# Patient Record
Sex: Female | Born: 1940 | Race: White | Hispanic: No | Smoking: Never smoker
Health system: Southern US, Community
[De-identification: ages and names within clinical notes are randomized; demographics above are authoritative.]

## PROBLEM LIST (undated history)

## (undated) DIAGNOSIS — T7840XA Allergy, unspecified, initial encounter: Secondary | ICD-10-CM

## (undated) DIAGNOSIS — M7120 Synovial cyst of popliteal space [Baker], unspecified knee: Secondary | ICD-10-CM

## (undated) DIAGNOSIS — M1990 Unspecified osteoarthritis, unspecified site: Secondary | ICD-10-CM

## (undated) DIAGNOSIS — K219 Gastro-esophageal reflux disease without esophagitis: Secondary | ICD-10-CM

## (undated) HISTORY — DX: Gastro-esophageal reflux disease without esophagitis: K21.9

## (undated) HISTORY — DX: Synovial cyst of popliteal space (Baker), unspecified knee: M71.20

## (undated) HISTORY — DX: Allergy, unspecified, initial encounter: T78.40XA

## (undated) HISTORY — PX: APPENDECTOMY: SHX54

## (undated) HISTORY — PX: TUBAL LIGATION: SHX77

## (undated) HISTORY — DX: Unspecified osteoarthritis, unspecified site: M19.90

## (undated) HISTORY — PX: COLONOSCOPY: SHX174

---

## 1997-09-29 ENCOUNTER — Ambulatory Visit (HOSPITAL_COMMUNITY): Admission: RE | Admit: 1997-09-29 | Discharge: 1997-09-29 | Payer: Self-pay | Admitting: Family Medicine

## 1997-09-29 ENCOUNTER — Encounter: Payer: Self-pay | Admitting: Family Medicine

## 1999-02-16 ENCOUNTER — Other Ambulatory Visit: Admission: RE | Admit: 1999-02-16 | Discharge: 1999-02-16 | Payer: Self-pay | Admitting: Family Medicine

## 1999-07-18 ENCOUNTER — Encounter: Payer: Self-pay | Admitting: Family Medicine

## 1999-07-18 ENCOUNTER — Ambulatory Visit (HOSPITAL_COMMUNITY): Admission: RE | Admit: 1999-07-18 | Discharge: 1999-07-18 | Payer: Self-pay | Admitting: Family Medicine

## 2001-02-13 ENCOUNTER — Encounter: Payer: Self-pay | Admitting: Family Medicine

## 2001-02-13 ENCOUNTER — Ambulatory Visit (HOSPITAL_COMMUNITY): Admission: RE | Admit: 2001-02-13 | Discharge: 2001-02-13 | Payer: Self-pay | Admitting: Family Medicine

## 2001-02-24 ENCOUNTER — Other Ambulatory Visit: Admission: RE | Admit: 2001-02-24 | Discharge: 2001-02-24 | Payer: Self-pay | Admitting: Family Medicine

## 2002-12-16 ENCOUNTER — Ambulatory Visit (HOSPITAL_COMMUNITY): Admission: RE | Admit: 2002-12-16 | Discharge: 2002-12-16 | Payer: Self-pay | Admitting: Family Medicine

## 2003-06-25 ENCOUNTER — Other Ambulatory Visit: Admission: RE | Admit: 2003-06-25 | Discharge: 2003-06-25 | Payer: Self-pay | Admitting: Family Medicine

## 2004-03-28 ENCOUNTER — Other Ambulatory Visit: Admission: RE | Admit: 2004-03-28 | Discharge: 2004-03-28 | Payer: Self-pay | Admitting: Obstetrics and Gynecology

## 2004-06-27 ENCOUNTER — Encounter (INDEPENDENT_AMBULATORY_CARE_PROVIDER_SITE_OTHER): Payer: Self-pay | Admitting: Specialist

## 2004-06-27 ENCOUNTER — Inpatient Hospital Stay (HOSPITAL_COMMUNITY): Admission: RE | Admit: 2004-06-27 | Discharge: 2004-06-29 | Payer: Self-pay | Admitting: Obstetrics and Gynecology

## 2005-12-13 ENCOUNTER — Ambulatory Visit (HOSPITAL_COMMUNITY): Admission: RE | Admit: 2005-12-13 | Discharge: 2005-12-13 | Payer: Self-pay | Admitting: Family Medicine

## 2005-12-17 ENCOUNTER — Ambulatory Visit: Payer: Self-pay | Admitting: Family Medicine

## 2006-01-17 ENCOUNTER — Ambulatory Visit: Payer: Self-pay | Admitting: Family Medicine

## 2006-01-17 LAB — CONVERTED CEMR LAB
Albumin: 3.6 g/dL (ref 3.5–5.2)
Alkaline Phosphatase: 74 units/L (ref 39–117)
BUN: 8 mg/dL (ref 6–23)
Basophils Absolute: 0 10*3/uL (ref 0.0–0.1)
CO2: 26 meq/L (ref 19–32)
GFR calc non Af Amer: 77 mL/min
Glomerular Filtration Rate, Af Am: 93 mL/min/{1.73_m2}
Glucose, Bld: 81 mg/dL (ref 70–99)
Hemoglobin: 13.8 g/dL (ref 12.0–15.0)
LDL DIRECT: 141.2 mg/dL
Monocytes Relative: 7.9 % (ref 3.0–11.0)
Platelets: 271 10*3/uL (ref 150–400)
Potassium: 3.9 meq/L (ref 3.5–5.1)
RBC: 4.3 M/uL (ref 3.87–5.11)
RDW: 13.1 % (ref 11.5–14.6)
Total Bilirubin: 0.6 mg/dL (ref 0.3–1.2)
Total Protein: 7 g/dL (ref 6.0–8.3)
Triglyceride fasting, serum: 65 mg/dL (ref 0–149)

## 2006-01-24 ENCOUNTER — Ambulatory Visit: Payer: Self-pay | Admitting: Family Medicine

## 2006-10-07 DIAGNOSIS — K219 Gastro-esophageal reflux disease without esophagitis: Secondary | ICD-10-CM | POA: Insufficient documentation

## 2006-10-07 DIAGNOSIS — J309 Allergic rhinitis, unspecified: Secondary | ICD-10-CM | POA: Insufficient documentation

## 2006-10-28 ENCOUNTER — Ambulatory Visit: Payer: Self-pay | Admitting: Family Medicine

## 2006-10-28 DIAGNOSIS — L290 Pruritus ani: Secondary | ICD-10-CM

## 2006-12-10 DIAGNOSIS — Z8719 Personal history of other diseases of the digestive system: Secondary | ICD-10-CM

## 2006-12-19 ENCOUNTER — Ambulatory Visit (HOSPITAL_COMMUNITY): Admission: RE | Admit: 2006-12-19 | Discharge: 2006-12-19 | Payer: Self-pay | Admitting: Family Medicine

## 2007-01-07 ENCOUNTER — Ambulatory Visit: Payer: Self-pay | Admitting: Family Medicine

## 2007-01-07 DIAGNOSIS — M810 Age-related osteoporosis without current pathological fracture: Secondary | ICD-10-CM

## 2007-01-07 DIAGNOSIS — M79609 Pain in unspecified limb: Secondary | ICD-10-CM

## 2008-05-13 ENCOUNTER — Ambulatory Visit (HOSPITAL_COMMUNITY): Admission: RE | Admit: 2008-05-13 | Discharge: 2008-05-13 | Payer: Self-pay | Admitting: Family Medicine

## 2009-02-21 ENCOUNTER — Telehealth: Payer: Self-pay | Admitting: Family Medicine

## 2009-02-25 ENCOUNTER — Ambulatory Visit: Payer: Self-pay | Admitting: Family Medicine

## 2009-02-25 DIAGNOSIS — F329 Major depressive disorder, single episode, unspecified: Secondary | ICD-10-CM

## 2009-02-25 DIAGNOSIS — F3289 Other specified depressive episodes: Secondary | ICD-10-CM | POA: Insufficient documentation

## 2009-05-19 ENCOUNTER — Ambulatory Visit: Payer: Self-pay | Admitting: Family Medicine

## 2009-06-06 ENCOUNTER — Ambulatory Visit: Payer: Self-pay | Admitting: Family Medicine

## 2009-06-09 DIAGNOSIS — L820 Inflamed seborrheic keratosis: Secondary | ICD-10-CM | POA: Insufficient documentation

## 2009-06-20 ENCOUNTER — Telehealth: Payer: Self-pay | Admitting: Internal Medicine

## 2009-06-20 DIAGNOSIS — R05 Cough: Secondary | ICD-10-CM

## 2010-02-26 LAB — CONVERTED CEMR LAB
AST: 19 units/L (ref 0–37)
Albumin: 4.1 g/dL (ref 3.5–5.2)
Alkaline Phosphatase: 76 units/L (ref 39–117)
BUN: 9 mg/dL (ref 6–23)
BUN: 9 mg/dL (ref 6–23)
Basophils Absolute: 0 10*3/uL (ref 0.0–0.1)
Basophils Absolute: 0 10*3/uL (ref 0.0–0.1)
Basophils Relative: 0.5 % (ref 0.0–1.0)
Blood in Urine, dipstick: NEGATIVE
CO2: 28 meq/L (ref 19–32)
CO2: 29 meq/L (ref 19–32)
Chloride: 105 meq/L (ref 96–112)
Cholesterol: 226 mg/dL (ref 0–200)
Direct LDL: 133.2 mg/dL
Direct LDL: 153.2 mg/dL
GFR calc Af Amer: 129 mL/min
Glucose, Bld: 74 mg/dL (ref 70–99)
Glucose, Urine, Semiquant: NEGATIVE
HCT: 40.9 % (ref 36.0–46.0)
HDL: 64.4 mg/dL (ref >39.00)
Hemoglobin: 13.7 g/dL (ref 12.0–15.0)
Hemoglobin: 13.9 g/dL (ref 12.0–15.0)
Lymphocytes Relative: 24.5 % (ref 12.0–46.0)
Lymphs Abs: 1.4 10*3/uL (ref 0.7–4.0)
MCHC: 33.5 g/dL (ref 30.0–36.0)
MCHC: 34.6 g/dL (ref 30.0–36.0)
MCV: 95.3 fL (ref 78.0–100.0)
Monocytes Absolute: 0.3 10*3/uL (ref 0.1–1.0)
Monocytes Absolute: 0.4 10*3/uL (ref 0.2–0.7)
Monocytes Relative: 6.2 % (ref 3.0–11.0)
Neutro Abs: 4 10*3/uL (ref 1.4–7.7)
Neutro Abs: 4.1 10*3/uL (ref 1.4–7.7)
Platelets: 257 10*3/uL (ref 150.0–400.0)
Potassium: 3.5 meq/L (ref 3.5–5.1)
Potassium: 4.1 meq/L (ref 3.5–5.1)
RDW: 14 % (ref 11.5–14.6)
Sodium: 139 meq/L (ref 135–145)
Sodium: 139 meq/L (ref 135–145)
Specific Gravity, Urine: 1.015
TSH: 2.5 microintl units/mL (ref 0.35–5.50)
TSH: 2.68 microintl units/mL (ref 0.35–5.50)
Total Bilirubin: 0.7 mg/dL (ref 0.3–1.2)
Total Protein: 7 g/dL (ref 6.0–8.3)
WBC Urine, dipstick: NEGATIVE
pH: 6.5

## 2010-03-02 NOTE — Progress Notes (Signed)
Summary: PT REQ MED  Phone Note Outgoing Call   Caller: Patient Reason for Call: Talk to Nurse, Talk to Doctor Summary of Call: Pts husband recently passed Cheryl Franklin, also a pt of Dr. Tawanna Cooler) and the funeral is today.... Pt called to req that something be sent in to [facility] to assist with anxiety, problems with nerves due to present loss.... Pt made appt this 06-28-22 - Jan. 28, 2011 @ 11:15am to see Dr Tawanna Cooler due to present c/c  but req that med be sent in today to help make it through the funeral today.  Initial call taken by: Debbra Riding,  February 21, 2009 9:06 AM Summary of Call: Doreatha Martin was in the barn and a bull crushed him.  He died instantly this past 06/28/22  Follow-up for Phone Call        Fleet Contras please call N. Ativan .5, dispense 30 tablets, one nightly p.r.n. refills x 2 Follow-up by: Roderick Pee MD,  February 21, 2009 9:13 AM    New/Updated Medications: ATIVAN 0.5 MG TABS (LORAZEPAM) one by mouth q hs as needed anxiety Prescriptions: ATIVAN 0.5 MG TABS (LORAZEPAM) one by mouth q hs as needed anxiety  #30 x 2   Entered by:   Kern Reap CMA (AAMA)   Authorized by:   Roderick Pee MD   Signed by:   Kern Reap CMA (AAMA) on 02/21/2009   Method used:   Telephoned to ...       [facility]  [address] [phone] (retail[facility]       [address]       [place], [place]  [address]       Ph: [phone] or [phone]       Fax: [phone]   RxID:   [id number]

## 2010-03-02 NOTE — Progress Notes (Signed)
Summary: pathology  Phone Note Call from Patient   Caller: Patient Call For: Roderick Pee MD Summary of Call: Pt. is asking for pathology results. [phone] Initial call taken by: Lynann Beaver CMA,  Jun 20, 2009 3:47 PM  Follow-up for Phone Call        report shows a seborrheic keratosis benign Follow-up by: Roderick Pee MD,  Jun 20, 2009 3:52 PM  Additional Follow-up for Phone Call Additional follow up Details #1::        Per Dr. Lovell Sheehan...get chest xray.  Pt will go tomorrow. Additional Follow-up by: Lynann Beaver CMA,  Jun 20, 2009 4:02 PM  New Problems: COUGH (ICD-786.2)   New Problems: COUGH (ICD-786.2)  Appended Document: pathology Previous NOTES regarding chest xray is an error.     Pt given pathology results.

## 2010-03-02 NOTE — Assessment & Plan Note (Signed)
Summary: ANXIETY CONCERNS // RS   Vital Signs:  Patient profile:   70 year old female Weight:      154 pounds Temp:     98.6 degrees F BP sitting:   156 / 70  Vitals Entered By: Lynann Beaver CMA (February 25, 2009 11:11 AM) CC: anxiety   CC:  anxiety.  History of Present Illness: Cheryl Franklin is a 70 year old female recently widowed last Friday. Her husband Sam got crushed by an animal on the farm.  She would like something for anxiety and sleep dysfunction.  We called, and Ativan .5 nightly, however, she, states it's too strong  Allergies: No Known Drug Allergies  Past History:  Past medical, surgical, family and social histories (including risk factors) reviewed for relevance to current acute and chronic problems.  Past Medical History: Reviewed history from 01/07/2007 and no changes required. Allergic rhinitis GERD Osteoporosis  Past Surgical History: Reviewed history from 10/07/2006 and no changes required. CB x2 TAH Appendectomy Colonoscopy  Family History: Reviewed history from 10/07/2006 and no changes required. Family History Diabetes 1st degree relative Family History of Stroke M 1st degree relative <50 Family History of Cardiovascular disorder Family History Weight disorder Family History Hypertension  Social History: Reviewed history from 10/07/2006 and no changes required. Occupation: Never Smoked Alcohol use-no Drug use-no Regular exercise-no  Review of Systems      See HPI  Physical Exam  General:  Well-developed,well-nourished,in no acute distress; alert,appropriate and cooperative throughout examination Psych:  Oriented X3 and tearful.     Problems:  Medical Problems Added: 1)  Dx of Depressive Disorder  (ICD-311)  Impression & Recommendations:  Problem # 1:  DEPRESSIVE DISORDER (ICD-311) Assessment New  The following medications were removed from the medication list:    Ativan 0.5 Mg Tabs (Lorazepam) ..... One by mouth q hs as  needed anxiety Her updated medication list for this problem includes:    Celexa 10 Mg Tabs (Citalopram hydrobromide) .Marland Kitchen... 1 tab @ bedtime    Valium 2 Mg Tabs (Diazepam) .Marland Kitchen... 1 tab @ bedtime  Complete Medication List: 1)  Celexa 10 Mg Tabs (Citalopram hydrobromide) .Marland Kitchen.. 1 tab @ bedtime 2)  Valium 2 Mg Tabs (Diazepam) .Marland Kitchen.. 1 tab @ bedtime  Patient Instructions: 1)  begin Valium 2 mg nightly along with Celexa 10 mg nightly return in two weeks for follow-up, sooner if any problems Prescriptions: VALIUM 2 MG TABS (DIAZEPAM) 1 tab @ bedtime  #30 x 3   Entered and Authorized by:   Roderick Pee MD   Signed by:   Roderick Pee MD on 02/25/2009   Method used:   Print then Give to Patient   RxID:   [id number] CELEXA 10 MG TABS (CITALOPRAM HYDROBROMIDE) 1 tab @ bedtime  #30 x 3   Entered and Authorized by:   Roderick Pee MD   Signed by:   Roderick Pee MD on 02/25/2009   Method used:   Print then Give to Patient   RxID:   [id number]

## 2010-03-02 NOTE — Progress Notes (Signed)
  Phone Note Call from Patient   Caller: Patient Reason for Call: Talk to Doctor Summary of Call: Pt called to notify Dr Tawanna Cooler that her husband Chistine Dematteo) passed away this past week, funeral is  today. Initial call taken by: Debbra Riding,  February 21, 2009 9:07 AM

## 2010-03-02 NOTE — Miscellaneous (Signed)
Summary: Consent for Mole Removal   Consent for Mole Removal   Imported By: Maryln Gottron 06/08/2009 14:15:09  _____________________________________________________________________  External Attachment:    Type:   Image     Comment:   External Document

## 2010-03-02 NOTE — Assessment & Plan Note (Signed)
Summary: EMP/PT FASTING/CJR   Vital Signs:  Patient profile:   70 year old female Height:      63.25 inches Weight:      150 pounds BMI:     26.46 Temp:     97.7 degrees F oral BP sitting:   128 / 84  (left arm) Cuff size:   regular  Vitals Entered By: Kern Reap CMA Duncan Dull) (May 19, 2009 9:32 AM) CC: cpx Is Patient Diabetic? No   CC:  cpx.  History of Present Illness: Cheryl Franklin is a 70 year old, widowed female........ her husband got crushed by a animal on the farm 3 months ago........ who comes in today for physical examination  She stealing with the grief appropriately by going to counseling.  We did start on some medication, but she doesn't feel like she needs them.  She has a lesion on her left neck &  on her anterior chest wall that are irritated.  She would like addressed.she does have light skin and light eyes.  her past medical history, social history, family history back sedation.  History were reviewed in detail.  Tetanus 2007, Pneumovax 2008, seasonal flu 2010.  She is considering the shingles, vaccine.  She remains physically active on the farm.  She is dealing with depression from the grief of her husbands death appropriately.  Hearing normal.  ADLs.  Normal.  The fall wrist.  Negligible.  Home safety normal.  Height weight stable.  Visual acuity checked yearly by ophthalmologist.  Allergies: No Known Drug Allergies  Past History:  Past medical, surgical, family and social histories (including risk factors) reviewed, and no changes noted (except as noted below).  Past Medical History: Reviewed history from 01/07/2007 and no changes required. Allergic rhinitis GERD Osteoporosis  Past Surgical History: Reviewed history from 10/07/2006 and no changes required. CB x2 TAH Appendectomy Colonoscopy  Family History: Reviewed history from 10/07/2006 and no changes required. Family History Diabetes 1st degree relative Family History of Stroke M 1st degree  relative <50 Family History of Cardiovascular disorder Family History Weight disorder Family History Hypertension  Social History: Reviewed history from 10/07/2006 and no changes required. Occupation: Never Smoked Alcohol use-no Drug use-no Regular exercise-no  Review of Systems      See HPI  Physical Exam  General:  Well-developed,well-nourished,in no acute distress; alert,appropriate and cooperative throughout examination Head:  Normocephalic and atraumatic without obvious abnormalities. No apparent alopecia or balding. Eyes:  No corneal or conjunctival inflammation noted. EOMI. Perrla. Funduscopic exam benign, without hemorrhages, exudates or papilledema. Vision grossly normal. Ears:  External ear exam shows no significant lesions or deformities.  Otoscopic examination reveals clear canals, tympanic membranes are intact bilaterally without bulging, retraction, inflammation or discharge. Hearing is grossly normal bilaterally. Nose:  External nasal examination shows no deformity or inflammation. Nasal mucosa are pink and moist without lesions or exudates. Mouth:  Oral mucosa and oropharynx without lesions or exudates.  Teeth in good repair. Neck:  No deformities, masses, or tenderness noted. Chest Wall:  No deformities, masses, or tenderness noted. Breasts:  No mass, nodules, thickening, tenderness, bulging, retraction, inflamation, nipple discharge or skin changes noted.   Lungs:  Normal respiratory effort, chest expands symmetrically. Lungs are clear to auscultation, no crackles or wheezes. Heart:  Normal rate and regular rhythm. S1 and S2 normal without gallop, murmur, click, rub or other extra sounds. Abdomen:  Bowel sounds positive,abdomen soft and non-tender without masses, organomegaly or hernias noted. Rectal:  No external abnormalities noted. Normal sphincter tone.  No rectal masses or tenderness. Genitalia:  Pelvic Exam:        External: normal female genitalia without  lesions or masses        Vagina: normal without lesions or masses        Cervix: normal without lesions or masses        Adnexa: normal bimanual exam without masses or fullness        Uterus: normal by palpation        Pap smear: performed Msk:  No deformity or scoliosis noted of thoracic or lumbar spine.   Pulses:  R and L carotid,radial,femoral,dorsalis pedis and posterior tibial pulses are full and equal bilaterally Extremities:  No clubbing, cyanosis, edema, or deformity noted with normal full range of motion of all joints.   Neurologic:  No cranial nerve deficits noted. Station and gait are normal. Plantar reflexes are down-going bilaterally. DTRs are symmetrical throughout. Sensory, motor and coordinative functions appear intact. Skin:  total body skin exam normal except for a red, irritated lesion on the left side of her neck and also one on her anterior chest wall.  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  A freckle in her left labia and two.  Cystic lesions in her left labia.  All of which appear benign Cervical Nodes:  No lymphadenopathy noted Axillary Nodes:  No palpable lymphadenopathy Inguinal Nodes:  No significant adenopathy Psych:  Cognition and judgment appear intact. Alert and cooperative with normal attention span and concentration. No apparent delusions, illusions, hallucinations   Impression & Recommendations:  Problem # 1:  DEPRESSIVE DISORDER (ICD-311) Assessment Unchanged  The following medications were removed from the medication list:    Celexa 10 Mg Tabs (Citalopram hydrobromide) .Marland Kitchen... 1 tab @ bedtime    Valium 2 Mg Tabs (Diazepam) .Marland Kitchen... 1 tab @ bedtime  Orders: Venipuncture (16109) TLB-Lipid Panel (80061-LIPID) TLB-BMP (Basic Metabolic Panel-BMET) (80048-METABOL) TLB-CBC Platelet - w/Differential (85025-CBCD) TLB-Hepatic/Liver Function Pnl (80076-HEPATIC) TLB-TSH (Thyroid Stimulating Hormone) (84443-TSH) UA Dipstick w/o Micro (automated)   (81003)  Problem # 2:  HEALTH SCREENING (ICD-V70.0) Assessment: Unchanged  Orders: Venipuncture (60454) TLB-Lipid Panel (80061-LIPID) TLB-BMP (Basic Metabolic Panel-BMET) (80048-METABOL) TLB-CBC Platelet - w/Differential (85025-CBCD) TLB-Hepatic/Liver Function Pnl (80076-HEPATIC) TLB-TSH (Thyroid Stimulating Hormone) (84443-TSH) UA Dipstick w/o Micro (automated)  (81003) EKG w/ Interpretation (93000)  Patient Instructions: 1)  return for a 30 minute appointment sometime in the next couple weeks to have the 2 mol removed 2)  Please schedule a follow-up appointment in 1 year. 3)  Schedule your mammogram. 4)  Schedule a colonoscopy/sigmoidoscopy to help detect colon cancer. 5)  Take calcium +Vitamin D daily. 6)  Take an Aspirin every day.   Immunization History:  Influenza Immunization History:    Influenza:  historical (10/29/2008)   Laboratory Results   Urine Tests  Date/Time Recieved: May 19, 2009 1:00 PM  Date/Time Reported: May 19, 2009 1:00 PM   Routine Urinalysis   Color: yellow Appearance: Clear Glucose: negative   (Normal Range: Negative) Bilirubin: negative   (Normal Range: Negative) Ketone: negative   (Normal Range: Negative) Spec. Gravity: 1.015   (Normal Range: 1.003-1.035) Blood: negative   (Normal Range: Negative) pH: 6.5   (Normal Range: 5.0-8.0) Protein: negative   (Normal Range: Negative) Urobilinogen: 0.2   (Normal Range: 0-1) Nitrite: negative   (Normal Range: Negative) Leukocyte Esterace: negative   (Normal Range: Negative)    Comments: Cheryl Franklin, CMA  May 19, 2009 1:00 PM

## 2010-03-02 NOTE — Assessment & Plan Note (Signed)
Summary: mole check00ok per dr Dejha King//ccm   Procedure Note Last Tetanus: Historical (01/29/2005)  Mole Biopsy/Removal: Indication: rule out cancer Consent signed: yes  Procedure # 1: elliptical incision with 2 mm margin    Size (in cm): 1.0 x 1.0    Region: anterior    Location: neck-anterior    Instrument used: #15 blade    Anesthesia: 1% lidocaine w/epinephrine    Closure: caut  Procedure # 2: elliptical incision with 2 mm margin    Size (in cm): 1.2 x 1.2    Region: dorsal    Location: chest-left    Instrument used: #15 blade    Anesthesia: 1% lidocaine w/epinephrine    Closure: caut  Cleaned and prepped with: alcohol Wound dressing: bandaid   History of Present Illness: Cheryl Franklin is a 70 year old, widowed female.  Her husband Sam died this last winter.  He was crushed by an animal on the farm, who comes in today for removal of 2 mol.  One is 10-mm x 10mm  left side of her neck.  Number two is 12-mm's by 12-mm.  This left side of her anterior chest wall.  Both are irritated ,red and puritic   Allergies: No Known Drug Allergies   Other Orders: Shave Skin Lesion 0.6-1.0cm scalp/neck/hands/feet/genitalia (95621)  Patient Instructions: 1)  remove the Band-Aid tomorrow if we do not call you within two weeks.  Call us

## 2010-06-16 NOTE — H&P (Signed)
Cheryl Franklin, SANAGUSTIN                  ACCOUNT NO.:  [id number]   MEDICAL RECORD NO.:  [id number]          PATIENT TYPE:  INP   LOCATION:  NA                           FACILITY:  [facility]   PHYSICIAN:  Daniel L. Gottsegen, M.D.DATE OF BIRTH:  Nov 30, 1940   DATE OF ADMISSION:  DATE OF DISCHARGE:                                HISTORY & PHYSICAL   DATE OF ADMISSION:  Patient is scheduled for surgery at [facility]  on Tuesday, Jun 27, 2004.   CHIEF COMPLAINT:  Symptomatic uterine prolapse.   HISTORY OF PRESENT ILLNESS:  The patient is a 70 year old gravida 2, para 2,  AB 0 who has started to notice a progressively increasing amount of  symptomatic pelvic prolapse.  When she is on her feet for any period of time  she can notice that her bladder has dropped, although she is not incontinent  of urine, she sees a large bulge.  She has tremendous pelvic pressure. It is  very uncomfortable.  It has made it impossible for her to have intercourse.  She is also having trouble getting stool out and has to use perineal  pressure to do so.  On physical examination she has a third degree  cystocele, second degree rectocele and almost third degree uterine prolapse.  She was assessed by Dr. Darvin Neighbours to see if she needed a sling at the time  the relaxation was corrected and he did not feel she did.  She would like to  have her ovaries removed but after discussing all the issues we will only  remove them if we can do it vaginally.  We will not laparoscope her or make  an incision to remove her ovaries.  She therefore now enters the hospital  for vaginal hysterectomy, anterior and posterior repair, and bilateral  salpingo-oophorectomy if it can be done vaginally.   PAST MEDICAL HISTORY:   CURRENT MEDICATIONS:  1. Fosamax for bone loss.  2. Calcium.     ALLERGIES:  She is allergic to no drugs.   FAMILY HISTORY:  Father is diabetic.  Mother is hypertensive.  Mother and  father both have  coronary artery disease.   PAST SURGICAL HISTORY:  Appendectomy in 1952 in [place].   SOCIAL HISTORY:  She is a nonsmoker, nondrinker.   REVIEW OF SYMPTOMS:  HEENT:  Negative.  CARDIAC:  Negative.  RESPIRATORY:  Negative.  GASTROINTESTINAL:  Reveals a history of gas, constipation.  GENITOURINARY: See above.  NEUROLOGICAL/PSYCHIATRIC:  Negative.  ALLERGIC/IMMUNOLOGICAL [facility] ENDOCRINE:  Negative.   PHYSICAL EXAMINATION:  GENERAL APPEARANCE:  Patient is a well-developed,  well-nourished female in no acute distress.  VITAL SIGNS:  Her blood pressure is 130/70, pulse 80 and regular,  respirations 16 and nonlabored, she is afebrile.  HEENT:  All within normal limits.  NECK:  Supple.  Trachea is in midline.  Thyroid is not enlarged.  LUNGS:  Clear to auscultation and percussion.  HEART:  No thrills, heaves or murmurs.  BREASTS:  No masses.  ABDOMEN:  Soft without guarding, rebound or masses.  PELVIC:  External is  normal.  BUS is normal except for urethral caruncle.  Bladder reveals third degree cystocele.  Vagina reveals second degree  rectocele with good vault support.  Cervix is clean.  Uterus is normal size  and shape with third degree descensus. Adnexa fails to reveal masses.  Anus  and perineum normal.  Digital rectal examination is negative.   ADMISSION IMPRESSION:  1. Uterine prolapse.  2. Cystocele.  3. Rectocele.     PLAN:  See above.      DLG/MEDQ  D:  06/24/2004  T:  06/24/2004  Job:  [id number]

## 2010-06-16 NOTE — Discharge Summary (Signed)
[name], Cheryl Franklin                  ACCOUNT NO.:  [id number]   MEDICAL RECORD NO.:  [id number]          PATIENT TYPE:  INP   LOCATION:  1613                         FACILITY:  [facility]   PHYSICIAN:  Daniel L. Gottsegen, M.D.DATE OF BIRTH:  [birth year]   DATE OF ADMISSION:  06/27/2004  DATE OF DISCHARGE:  06/29/2004                                 DISCHARGE SUMMARY   The patient is a 70 year old female who was admitted to the hospital with  symptomatic uterine prolapse, cystocele and rectocele.  On the day of  admission she was taken to the operating room and a vaginal hysterectomy,  anterior and posterior repair were done.  Postoperatively she did well and  on the second postoperative day she was ready for discharge.  She was  discharged on Vicodin for pain relief and Macrobid one b.i.d. with food for  four days to cover a urinary tract infection.  She will be seen in our  office in three weeks.   Condition on discharge is improved.   DISCHARGE DIAGNOSES:  1.  Uterine prolapse.  2.  Cystocele.  3.  Rectocele.   OPERATION:  Vaginal hysterectomy, anterior and posterior repair.      DLG/MEDQ  D:  06/29/2004  T:  06/29/2004  Job:  [id number]

## 2010-06-16 NOTE — Op Note (Signed)
NAMESHONTERIA, Cheryl Franklin                  ACCOUNT NO.:  [id number]   MEDICAL RECORD NO.:  [id number]          PATIENT TYPE:  INP   LOCATION:  0003                         FACILITY:  [facility]   PHYSICIAN:  Daniel L. Gottsegen, M.D.DATE OF BIRTH:  1940/11/21   DATE OF PROCEDURE:  06/27/2004  DATE OF DISCHARGE:                                 OPERATIVE REPORT   PREOPERATIVE DIAGNOSIS:  Uterine prolapse, cystocele, and rectocele.   POSTOPERATIVE DIAGNOSIS:  Uterine prolapse, cystocele, and rectocele.   OPERATION:  Vaginal hysterectomy, anterior and posterior repair.   SURGEON:  Daniel L. Eda Paschal, M.D.   FIRST ASSISTANT:  Ivor Costa. Farrel Gobble, M.D.   ANESTHESIA:  General endotracheal.   FINDINGS:  The patient had third-degree uterine descensus, third-degree  cystocele, and a second-degree rectocele.  Ovaries and fallopian tubes were  exceedingly high and lateral but could be inspected and showed no pathology.  The pelvic peritoneum was free of any disease.   DESCRIPTION OF PROCEDURE:  After general endotracheal anesthesia, the  patient was placed in the dorsal lithotomy position and prepped and draped  in the usual sterile manner.  A 1:200,000 solution of epinephrine was  injected around the cervix.  The bladder was mobilized superiorly as was the  posterior peritoneum.  The posterior peritoneum and vesicouterine fold  peritoneum were entered with sharp dissection.  The uterosacral ligaments  were clamped.  In clamping them, they were shortened, and then they were  sutured to the wall laterally for good vault support.  The cardinal ligament  and uterine artery, balance of the broad ligament, uteroovarian ligaments,  and round ligaments, as well as the fallopian tubes were clamped, cut, and  suture ligated.  Suture material for all the above-mentioned pedicles was  normochromic catgut with major vascular bundles doubly ligated.  The uterus  was delivered and sent to pathology for tissue  diagnosis.  The patient had  wanted me to make an attempt to remove her ovaries and tubes.  We could see  them, although they were exceedingly high and lateral, but they were not  removable vaginally.  As per our preoperative agreement, we were not going  to make an incision under laparoscopy to remove normal ovaries.  The vaginal  cuff was then whip-stitched to the posterior peritoneum with a running  locking 0 Vicryl.  A modified Moschowitz enterocele suture was placed with 2-  0 Vicryl but was not tied in place yet.   Attention was next turned to the anterior repair.  The vaginal mucosa was  opened all the way from the top of the cuff to the urethrovesical angle.  The patient had an excellent urethrovesical angle, and preoperatively the  urologist felt that a sling was not necessary.  The vesicovaginal fascia  could be sharply dissected free, and now the cystocele had been separated  from the mucosa.  The cystocele was reduced with interrupted sutures of 2-0  Vicryl incorporating good vesicovaginal fascia.  Redundant vaginal mucosa  was trimmed away, and then the anterior vaginal mucosa was closed with a  running locking 2-0 Vicryl, also  picking up fascia underneath for additional  support.   At this point, the cuff was closed with figure-of-eights of #1 chromic  catgut.  The modified Moschowitz suture was tied in place, and attention was  turned to the posterior repair.  The posterior mucosa was undermined from  the introitus all the way up to the top of the cuff.  The perirectal fascia  was sharply dissected free as was the rectocele.  The surgeon used his  finger in the rectum for a lot of the dissection, and then the rectocele was  reduced with interrupted 2-0 Vicryl, picking up the perirectal fascia.  Redundant vaginal mucosa was trimmed away, and then the posterior vaginal  mucosa was closed, once again picking up the fascia for good additional  support.  The peritoneum was  built up with interrupted sutures of 2-0 Vicryl  through the transverse peritonei muscles, and the rest of the mucosa was  closed.   Estimated blood loss for the entire procedure was less than 100 mL with none  replaced.  The patient tolerated the procedure well and left the operating  room in satisfactory condition, draining clear urine from her Foley  catheter.      DLG/MEDQ  D:  06/27/2004  T:  06/27/2004  Job:  [id number]

## 2011-02-02 DIAGNOSIS — M1710 Unilateral primary osteoarthritis, unspecified knee: Secondary | ICD-10-CM | POA: Diagnosis not present

## 2011-02-02 DIAGNOSIS — M238X9 Other internal derangements of unspecified knee: Secondary | ICD-10-CM | POA: Diagnosis not present

## 2011-02-13 DIAGNOSIS — M1710 Unilateral primary osteoarthritis, unspecified knee: Secondary | ICD-10-CM | POA: Diagnosis not present

## 2011-02-21 DIAGNOSIS — M2340 Loose body in knee, unspecified knee: Secondary | ICD-10-CM | POA: Diagnosis not present

## 2011-02-21 DIAGNOSIS — M7120 Synovial cyst of popliteal space [Baker], unspecified knee: Secondary | ICD-10-CM | POA: Diagnosis not present

## 2011-04-16 ENCOUNTER — Other Ambulatory Visit (INDEPENDENT_AMBULATORY_CARE_PROVIDER_SITE_OTHER): Payer: Medicare Other

## 2011-04-16 DIAGNOSIS — E039 Hypothyroidism, unspecified: Secondary | ICD-10-CM | POA: Diagnosis not present

## 2011-04-16 DIAGNOSIS — Z0000 Encounter for general adult medical examination without abnormal findings: Secondary | ICD-10-CM | POA: Diagnosis not present

## 2011-04-16 DIAGNOSIS — I10 Essential (primary) hypertension: Secondary | ICD-10-CM

## 2011-04-16 DIAGNOSIS — D649 Anemia, unspecified: Secondary | ICD-10-CM

## 2011-04-16 DIAGNOSIS — E785 Hyperlipidemia, unspecified: Secondary | ICD-10-CM

## 2011-04-16 DIAGNOSIS — T887XXA Unspecified adverse effect of drug or medicament, initial encounter: Secondary | ICD-10-CM

## 2011-04-16 LAB — BASIC METABOLIC PANEL
BUN: 14 mg/dL (ref 6–23)
Chloride: 105 mEq/L (ref 96–112)
Creatinine, Ser: 0.7 mg/dL (ref 0.4–1.2)
GFR: 83.78 mL/min (ref >60.00)
Potassium: 5 mEq/L (ref 3.5–5.1)

## 2011-04-16 LAB — HEPATIC FUNCTION PANEL
ALT: 17 U/L (ref 0–35)
AST: 19 U/L (ref 0–37)
Alkaline Phosphatase: 82 U/L (ref 39–117)
Bilirubin, Direct: 0.1 mg/dL (ref 0.0–0.3)
Total Bilirubin: 0.3 mg/dL (ref 0.3–1.2)
Total Protein: 7.8 g/dL (ref 6.0–8.3)

## 2011-04-16 LAB — CBC WITH DIFFERENTIAL/PLATELET
Basophils Relative: 0.6 % (ref 0.0–3.0)
Eosinophils Relative: 1.9 % (ref 0.0–5.0)
MCV: 94.8 fl (ref 78.0–100.0)
Monocytes Relative: 7.3 % (ref 3.0–12.0)
Neutrophils Relative %: 64.9 % (ref 43.0–77.0)
Platelets: 269 10*3/uL (ref 150.0–400.0)
RBC: 4.31 Mil/uL (ref 3.87–5.11)
WBC: 5.9 10*3/uL (ref 4.5–10.5)

## 2011-04-16 LAB — LDL CHOLESTEROL, DIRECT: Direct LDL: 123.7 mg/dL

## 2011-04-16 LAB — POCT URINALYSIS DIPSTICK
Bilirubin, UA: NEGATIVE
Blood, UA: NEGATIVE
Ketones, UA: NEGATIVE
Leukocytes, UA: NEGATIVE
Protein, UA: NEGATIVE
Spec Grav, UA: 1.02
pH, UA: 5.5

## 2011-04-16 LAB — LIPID PANEL
HDL: 65.8 mg/dL (ref >39.00)
Total CHOL/HDL Ratio: 3
VLDL: 11.8 mg/dL (ref 0.0–40.0)

## 2011-04-19 ENCOUNTER — Encounter: Payer: Self-pay | Admitting: Family Medicine

## 2011-04-23 ENCOUNTER — Ambulatory Visit (INDEPENDENT_AMBULATORY_CARE_PROVIDER_SITE_OTHER): Payer: Medicare Other | Admitting: Family Medicine

## 2011-04-23 ENCOUNTER — Encounter: Payer: Self-pay | Admitting: Family Medicine

## 2011-04-23 VITALS — BP 140/90 | Temp 98.1°F | Ht 63.5 in | Wt 143.0 lb

## 2011-04-23 DIAGNOSIS — M810 Age-related osteoporosis without current pathological fracture: Secondary | ICD-10-CM

## 2011-04-23 DIAGNOSIS — J309 Allergic rhinitis, unspecified: Secondary | ICD-10-CM

## 2011-04-23 DIAGNOSIS — K219 Gastro-esophageal reflux disease without esophagitis: Secondary | ICD-10-CM

## 2011-04-23 NOTE — Progress Notes (Signed)
  Subjective:    Patient ID: Cheryl Franklin, female    DOB: 06-18-1940, 71 y.o.   MRN: [id number]  HPI Cheryl Franklin is a delightful 71 year old female who comes in today for a Medicare wellness examination  She takes no medicine on a regular basis except for a baby aspirin 3 times weekly  She gets routine eye care, regular dental care, hearing normal, breast exam monthly, mammogram 2011, colonoscopy 2007.  Cognitive function normal she does not exercise on a regular basis. Home health safety reviewed no issues identified, no guns in the house, she does not have a health care power of attorney nor living will. Encouraged to do that.  Tetanus 2007 Pneumovax 2008 encouraged to get shingles vaccine and annual flu shot   Review of Systems  Constitutional: Negative.   HENT: Negative.   Eyes: Negative.   Respiratory: Negative.   Cardiovascular: Negative.   Gastrointestinal: Negative.   Genitourinary: Negative.   Musculoskeletal: Negative.   Neurological: Negative.   Hematological: Negative.   Psychiatric/Behavioral: Negative.        Objective:   Physical Exam  Constitutional: She appears well-developed and well-nourished.  HENT:  Head: Normocephalic and atraumatic.  Right Ear: External ear normal.  Left Ear: External ear normal.  Nose: Nose normal.  Mouth/Throat: Oropharynx is clear and moist.  Eyes: EOM are normal. Pupils are equal, round, and reactive to light. Right eye exhibits no discharge. Left eye exhibits no discharge. No scleral icterus.  Neck: Normal range of motion. Neck supple. No JVD present. No tracheal deviation present. No thyromegaly present.  Cardiovascular: Normal rate, regular rhythm, normal heart sounds and intact distal pulses.  Exam reveals no gallop and no friction rub.   No murmur heard. Pulmonary/Chest: Effort normal and breath sounds normal.  Abdominal: Soft. Bowel sounds are normal. She exhibits no distension and no mass. There is no tenderness. There is no rebound  and no guarding.  Genitourinary: Vagina normal and uterus normal. Guaiac negative stool. No vaginal discharge found.  Musculoskeletal: Normal range of motion. She exhibits no edema and no tenderness.  Lymphadenopathy:    She has no cervical adenopathy.  Neurological: She is alert. She has normal reflexes. No cranial nerve deficit. She exhibits normal muscle tone. Coordination normal.  Skin: Skin is warm and dry.  Psychiatric: She has a normal mood and affect. Her behavior is normal. Judgment and thought content normal.          Assessment & Plan:  Healthy female  Allergic rhinitis over-the-counter Claritin  Recommended monthly breast exam and annual mammogram continue followup in GI also recommend shingles vaccine

## 2011-04-23 NOTE — Patient Instructions (Signed)
Continue your good health habits  Claritin plain 10 mg in the morning for allergic rhinitis  Remember to walk 20 minutes daily take calcium and vitamin D for bone strength  Return in one year sooner if any problems  Strongly consider a shingles vaccine

## 2011-05-08 ENCOUNTER — Ambulatory Visit (INDEPENDENT_AMBULATORY_CARE_PROVIDER_SITE_OTHER): Payer: Medicare Other | Admitting: Family Medicine

## 2011-05-08 ENCOUNTER — Encounter: Payer: Self-pay | Admitting: Family Medicine

## 2011-05-08 DIAGNOSIS — L570 Actinic keratosis: Secondary | ICD-10-CM

## 2011-05-08 DIAGNOSIS — L820 Inflamed seborrheic keratosis: Secondary | ICD-10-CM | POA: Diagnosis not present

## 2011-05-08 NOTE — Progress Notes (Signed)
  Subjective:    Patient ID: Cheryl Franklin, female    DOB: November 13, 1940, 71 y.o.   MRN: [id number]  HPI Cheryl Franklin is a 71 year old female who comes in today for removal of 2 lesions from her right hand  Lesion #1 is 5 mm x5 mm its red and inflamed  Lesion #2 5 mm x 5 mm also red and inflamed clinically they appear to be inflamed actinic keratoses  After informed consent the lesions were cleaned with alcohol and then anesthetized with 1% Xylocaine with epinephrine. Both lesions were excised with 2 mm margins the bases were cauterized both lesions were sent for pathologic analysis. She tolerated the procedure no complications Path pending   Review of Systems General and dermatologic review of systems otherwise negative    Objective:   Physical Exam Procedure see above       Assessment & Plan:  Inflamed actinic keratoses x2 right arm removed it Path pending

## 2011-09-17 DIAGNOSIS — L255 Unspecified contact dermatitis due to plants, except food: Secondary | ICD-10-CM | POA: Diagnosis not present

## 2011-11-23 DIAGNOSIS — Z23 Encounter for immunization: Secondary | ICD-10-CM | POA: Diagnosis not present

## 2012-05-20 DIAGNOSIS — H2510 Age-related nuclear cataract, unspecified eye: Secondary | ICD-10-CM | POA: Diagnosis not present

## 2012-06-12 ENCOUNTER — Other Ambulatory Visit: Payer: Medicare Other

## 2012-06-19 ENCOUNTER — Encounter: Payer: Medicare Other | Admitting: Family Medicine

## 2012-06-25 ENCOUNTER — Other Ambulatory Visit: Payer: Self-pay | Admitting: Family Medicine

## 2012-06-25 DIAGNOSIS — Z1231 Encounter for screening mammogram for malignant neoplasm of breast: Secondary | ICD-10-CM

## 2012-07-01 ENCOUNTER — Ambulatory Visit (HOSPITAL_COMMUNITY)
Admission: RE | Admit: 2012-07-01 | Discharge: 2012-07-01 | Disposition: A | Discharge status: Home / Self Care | Payer: Medicare Other | Source: Ambulatory Visit | Attending: Family Medicine | Admitting: Family Medicine

## 2012-07-01 DIAGNOSIS — Z1231 Encounter for screening mammogram for malignant neoplasm of breast: Secondary | ICD-10-CM | POA: Diagnosis not present

## 2012-07-31 ENCOUNTER — Encounter: Payer: Medicare Other | Admitting: Family Medicine

## 2012-07-31 ENCOUNTER — Other Ambulatory Visit (INDEPENDENT_AMBULATORY_CARE_PROVIDER_SITE_OTHER): Payer: Medicare Other

## 2012-07-31 DIAGNOSIS — Z0000 Encounter for general adult medical examination without abnormal findings: Secondary | ICD-10-CM

## 2012-07-31 LAB — CBC WITH DIFFERENTIAL/PLATELET
Basophils Relative: 0.6 % (ref 0.0–3.0)
Eosinophils Absolute: 0.1 10*3/uL (ref 0.0–0.7)
HCT: 38.5 % (ref 36.0–46.0)
Lymphs Abs: 1.8 10*3/uL (ref 0.7–4.0)
MCHC: 33.1 g/dL (ref 30.0–36.0)
MCV: 94.3 fl (ref 78.0–100.0)
Monocytes Absolute: 0.4 10*3/uL (ref 0.1–1.0)
Neutrophils Relative %: 56.1 % (ref 43.0–77.0)
Platelets: 268 10*3/uL (ref 150.0–400.0)
RBC: 4.08 Mil/uL (ref 3.87–5.11)

## 2012-07-31 LAB — POCT URINALYSIS DIPSTICK
Bilirubin, UA: NEGATIVE
Blood, UA: NEGATIVE
Glucose, UA: NEGATIVE
Leukocytes, UA: NEGATIVE
Nitrite, UA: NEGATIVE
Urobilinogen, UA: 0.2

## 2012-07-31 LAB — HEPATIC FUNCTION PANEL
Bilirubin, Direct: 0 mg/dL (ref 0.0–0.3)
Total Bilirubin: 0.5 mg/dL (ref 0.3–1.2)

## 2012-07-31 LAB — BASIC METABOLIC PANEL
Calcium: 9 mg/dL (ref 8.4–10.5)
Glucose, Bld: 82 mg/dL (ref 70–99)
Sodium: 139 mEq/L (ref 135–145)

## 2012-07-31 LAB — TSH: TSH: 2.84 u[IU]/mL (ref 0.35–5.50)

## 2012-07-31 LAB — LIPID PANEL
LDL Cholesterol: 130 mg/dL — ABNORMAL HIGH (ref 0–99)
Total CHOL/HDL Ratio: 4
Triglycerides: 84 mg/dL (ref 0.0–149.0)

## 2012-08-07 ENCOUNTER — Ambulatory Visit (INDEPENDENT_AMBULATORY_CARE_PROVIDER_SITE_OTHER): Payer: Medicare Other | Admitting: Family Medicine

## 2012-08-07 ENCOUNTER — Encounter: Payer: Self-pay | Admitting: Family Medicine

## 2012-08-07 VITALS — BP 120/80 | Temp 98.4°F | Ht 63.75 in | Wt 144.0 lb

## 2012-08-07 DIAGNOSIS — R141 Gas pain: Secondary | ICD-10-CM

## 2012-08-07 DIAGNOSIS — R140 Abdominal distension (gaseous): Secondary | ICD-10-CM

## 2012-08-07 DIAGNOSIS — N951 Menopausal and female climacteric states: Secondary | ICD-10-CM

## 2012-08-07 DIAGNOSIS — M810 Age-related osteoporosis without current pathological fracture: Secondary | ICD-10-CM

## 2012-08-07 DIAGNOSIS — K219 Gastro-esophageal reflux disease without esophagitis: Secondary | ICD-10-CM

## 2012-08-07 DIAGNOSIS — J309 Allergic rhinitis, unspecified: Secondary | ICD-10-CM

## 2012-08-07 NOTE — Progress Notes (Signed)
  Subjective:    Patient ID: Cheryl Franklin, female    DOB: 03-21-1940, 72 y.o.   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 In 2006 she had her uterus removed because of prolapse. Ovaries were left intact. She states yesterday she was sitting at home and the air conditioning playing with her computer and all of a sudden she broke out with a hot flash. Sweated all over. No chest pain or shortness of breath.  She has a history of osteoporosis because of the premature menopause. She exercises on a daily basis and takes calcium and vitamin D. Followup bone density when do  She gets routine eye care, dental care, BSE monthly, and you mammography, colonoscopy and GI, vaccinations up-to-date  Cognitive function normal she walks on a daily basis home health safety reviewed no issues identified, no guns in the house, she does have a health care power of attorney and living well   Review of Systems  Constitutional: Negative.   HENT: Negative.   Eyes: Negative.   Respiratory: Negative.   Cardiovascular: Negative.   Gastrointestinal: Negative.   Genitourinary: Negative.   Musculoskeletal: Negative.   Neurological: Negative.   Psychiatric/Behavioral: Negative.        Objective:   Physical Exam  Nursing note and vitals reviewed. Constitutional: She appears well-developed and well-nourished.  HENT:  Head: Normocephalic and atraumatic.  Right Ear: External ear normal.  Left Ear: External ear normal.  Nose: Nose normal.  Mouth/Throat: Oropharynx is clear and moist.  Eyes: EOM are normal. Pupils are equal, round, and reactive to light.  Neck: Normal range of motion. Neck supple. No thyromegaly present.  Cardiovascular: Normal rate, regular rhythm, normal heart sounds and intact distal pulses.  Exam reveals no gallop and no friction rub.   No  murmur heard. No carotid or aortic bruits peripheral pulses 2+ and symmetrical  Pulmonary/Chest: Effort normal and breath sounds normal.  Abdominal: Soft. Bowel sounds are normal. She exhibits no distension and no mass. There is no tenderness. There is no rebound.  Genitourinary: Vagina normal. Guaiac negative stool. No vaginal discharge found.  Bilateral breast exam normal  Pelvic exam the vaginal vault appeared fairly normal. We don't see a lot of post menopausal hormonal deficiency changes in her vagina. She hasn't palpable tenderness in the right adnexa I cannot palpate a mass the  Musculoskeletal: Normal range of motion.  For list left popliteal space,,,,,,,, she has a popliteal cyst is followed by Dr. Cleophas Dunker is ruptured spontaneously in the past  Lymphadenopathy:    She has no cervical adenopathy.  Neurological: She is alert. She has normal reflexes. No cranial nerve deficit. She exhibits normal muscle tone. Coordination normal.  Skin: Skin is warm and dry.  Psychiatric: She has a normal mood and affect. Her behavior is normal. Judgment and thought content normal.          Assessment & Plan:  Healthy female  Recent history of hot flashes and right lower quadrant abdominal pain with some bloating we will set up a scan to rule out ovarian cancer  Status post hysterectomy 2006 for prolapse  History of osteoporosis again encouraged diet exercise daily  Cervical disc disease with pain but no dermatologic deficit Motrin 600 3 times a day when necessary  Baker's cyst left popliteal space followup by Dr. Cleophas Dunker

## 2012-08-07 NOTE — Patient Instructions (Addendum)
Continue daily walking to maintain your bone strength,,,,,,,,, we will schedule a followup bone density  We will schedule a pelvic ultrasound to evaluate her ovaries  Motrin 400 mg twice a day when necessary for neck pain  Followup with Dr. Cleophas Dunker on the Baker's cyst when necessary

## 2012-08-13 ENCOUNTER — Ambulatory Visit
Admission: RE | Admit: 2012-08-13 | Discharge: 2012-08-13 | Disposition: A | Discharge status: Home / Self Care | Payer: Medicare Other | Source: Ambulatory Visit | Attending: Family Medicine | Admitting: Family Medicine

## 2012-08-13 DIAGNOSIS — N951 Menopausal and female climacteric states: Secondary | ICD-10-CM

## 2012-08-13 DIAGNOSIS — R140 Abdominal distension (gaseous): Secondary | ICD-10-CM

## 2012-08-13 DIAGNOSIS — Z780 Asymptomatic menopausal state: Secondary | ICD-10-CM | POA: Diagnosis not present

## 2012-08-15 ENCOUNTER — Ambulatory Visit (INDEPENDENT_AMBULATORY_CARE_PROVIDER_SITE_OTHER)
Admission: RE | Admit: 2012-08-15 | Discharge: 2012-08-15 | Disposition: A | Discharge status: Home / Self Care | Payer: Medicare Other | Source: Ambulatory Visit | Attending: Family Medicine | Admitting: Family Medicine

## 2012-08-15 DIAGNOSIS — M810 Age-related osteoporosis without current pathological fracture: Secondary | ICD-10-CM | POA: Diagnosis not present

## 2012-11-12 DIAGNOSIS — Z23 Encounter for immunization: Secondary | ICD-10-CM | POA: Diagnosis not present

## 2013-08-04 ENCOUNTER — Encounter: Payer: Self-pay | Admitting: Podiatrist

## 2013-08-04 ENCOUNTER — Ambulatory Visit (INDEPENDENT_AMBULATORY_CARE_PROVIDER_SITE_OTHER): Payer: Medicare Other | Admitting: Podiatrist

## 2013-08-04 VITALS — BP 126/72 | HR 76 | Resp 12

## 2013-08-04 DIAGNOSIS — Q828 Other specified congenital malformations of skin: Secondary | ICD-10-CM | POA: Diagnosis not present

## 2013-08-04 DIAGNOSIS — M216X2 Other acquired deformities of left foot: Secondary | ICD-10-CM

## 2013-08-04 DIAGNOSIS — B070 Plantar wart: Secondary | ICD-10-CM | POA: Diagnosis not present

## 2013-08-04 DIAGNOSIS — M216X9 Other acquired deformities of unspecified foot: Secondary | ICD-10-CM

## 2013-08-04 DIAGNOSIS — B079 Viral wart, unspecified: Secondary | ICD-10-CM

## 2013-08-04 NOTE — Patient Instructions (Signed)
Corns and Calluses Corns are small areas of thickened skin that usually occur on the top, sides, or tip of a toe. They contain a cone-shaped core with a point that can press on a nerve below. This causes pain. Calluses are areas of thickened skin that usually develop on hands, fingers, palms, soles of the feet, and heels. These are areas that experience frequent friction or pressure. CAUSES  Corns are usually the result of rubbing (friction) or pressure from shoes that are too tight or do not fit properly. Calluses are caused by repeated friction and pressure on the affected areas. SYMPTOMS  A hard growth on the skin.  Pain or tenderness under the skin.  Sometimes, redness and swelling.  Increased discomfort while wearing tight-fitting shoes. DIAGNOSIS  Your caregiver can usually tell what the problem is by doing a physical exam. TREATMENT  Removing the cause of the friction or pressure is usually the only treatment needed. However, sometimes medicines can be used to help soften the hardened, thickened areas. These medicines include salicylic acid plasters and 12% ammonium lactate lotion. These medicines should only be used under the direction of your caregiver. HOME CARE INSTRUCTIONS   Try to remove pressure from the affected area.  You may wear donut-shaped corn pads to protect your skin.  You may use a pumice stone or nonmetallic nail file to gently reduce the thickness of a corn.  Wear properly fitted footwear.  If you have calluses on the hands, wear gloves during activities that cause friction.  If you have diabetes, you should regularly examine your feet. Tell your caregiver if you notice any problems with your feet. SEEK IMMEDIATE MEDICAL CARE IF:   You have increased pain, swelling, redness, or warmth in the affected area.  Your corn or callus starts to drain fluid or bleeds.  You are not getting better, even with treatment. Document Released: 10/22/2003 Document  Revised: 04/09/2011 Document Reviewed: 09/12/2010 Exit[employer] Patient Information 2015 [employer]. This information is not intended to replace advice given to you by your health care provider. Make sure you discuss any questions you have with your health care provider.  

## 2013-08-04 NOTE — Progress Notes (Addendum)
   Subjective:    Patient ID: Cheryl Franklin, female    DOB: 11-20-1940, 73 y.o.   MRN: [id number]  HPI  PT STATED  LT BALL OF THE FOOT HAVE CALLUS AND BEEN HURTING FOR 2 MONTHS. THE FOOT IS GETTING WORSE. THE FOOT GET AGGRAVATED BY PRESSURE AND SOMETIMES JUST SITTING HAVE SHAP PAIN. TRIED CORN REMOVAL BUT THE CORN ALWAYS COME BACK.    Review of Systems  All other systems reviewed and are negative.      Objective:   Physical Exam Patient is awake, alert, and oriented x 3.  In no acute distress.  Vascular status is intact with palpable pedal pulses at 2/4 DP and PT bilateral and capillary refill time within normal limits. Neurological sensation is also intact bilaterally via Semmes Weinstein monofilament at 5/5 sites. Light touch, vibratory sensation, Achilles tendon reflex is intact. Dermatological exam reveals skin color, turger and texture as normal.   Musculature intact with dorsiflexion, plantarflexion, inversion, eversion.   submet 4 porokeratotic lesion versus plantar verruca is present.  She has been applying acid to the skin and the tissue is macerated thus is it is difficult to tell whether there are skin tension lines present or not..  Central core is present.  Skin irritation lateral left ankle.  Appears to be dermatitis in nature.      Assessment & Plan:  Porokeratotic lesion versus verruca left x 1, dermatitis left ankle.  Plan:  Excised the lesion at the core with a 15 blade and covered with moleskin.  Sample of topicort was dispensed for the ankle.  She will return if symptoms do not improve.

## 2013-08-11 DIAGNOSIS — C44319 Basal cell carcinoma of skin of other parts of face: Secondary | ICD-10-CM | POA: Diagnosis not present

## 2013-08-11 DIAGNOSIS — L259 Unspecified contact dermatitis, unspecified cause: Secondary | ICD-10-CM | POA: Diagnosis not present

## 2013-08-18 ENCOUNTER — Ambulatory Visit (INDEPENDENT_AMBULATORY_CARE_PROVIDER_SITE_OTHER): Payer: Medicare Other | Admitting: Podiatrist

## 2013-08-18 ENCOUNTER — Encounter: Payer: Self-pay | Admitting: Podiatrist

## 2013-08-18 VITALS — BP 124/70 | HR 74 | Resp 12

## 2013-08-18 DIAGNOSIS — B070 Plantar wart: Secondary | ICD-10-CM | POA: Diagnosis not present

## 2013-08-18 NOTE — Progress Notes (Signed)
Subjective: Cheryl Franklin presents today for continued pain submetatarsal 4 of the left foot. She states that the last visit offered minimal relief and she feels like something is deep in her foot.  Objective: Neurovascular status is intact and unchanged. 2 the well-circumscribed area covered by a large callus sub-metatarsal for the left foot. Once excised a verruca is lesion is suspected.  Assessment: Verruca left foot  Plan: Anesthetized the foot with lidocaine plain was administered local to the lesion The area was prepped with Betadine and the lesion was excised deeply. Phenol was applied to the lesion itself and antibiotic ointment and a dressing was applied. We will send off the lesion for pathology. We will notify her if the result is anything other than a wart

## 2013-08-18 NOTE — Patient Instructions (Signed)
ANTIBACTERIAL SOAP INSTRUCTIONS      Shower as usual. Before getting out, place a drop of antibacterial liquid soap (Dial) on a wet, clean washcloth.  Gently wipe washcloth over affected area.  Afterward, rinse the area with warm water.  Blot the area dry with a soft cloth and cover with antibiotic ointment (neosporin, polysporin, bacitracin) and band aid or gauze and tape

## 2013-08-20 ENCOUNTER — Telehealth: Payer: Self-pay | Admitting: Podiatrist

## 2013-08-20 NOTE — Telephone Encounter (Signed)
Plantar wart sent to [facility] to rule out verruca  [facility] [address] [place] [address]  [phone]

## 2013-09-01 ENCOUNTER — Telehealth: Payer: Self-pay | Admitting: *Deleted

## 2013-09-01 NOTE — Telephone Encounter (Signed)
Called patient to let her know that the sorness is normal and that the biospy was a wart and if no better keep appointment for next Tuesday. lisa

## 2013-09-01 NOTE — Telephone Encounter (Signed)
I should check it if she is still in pain-- please check with [facility] for the pathology report.  I don't recall coming across it in [place]?

## 2013-09-08 ENCOUNTER — Ambulatory Visit (INDEPENDENT_AMBULATORY_CARE_PROVIDER_SITE_OTHER): Payer: Medicare Other | Admitting: Podiatrist

## 2013-09-08 ENCOUNTER — Encounter: Payer: Self-pay | Admitting: Podiatrist

## 2013-09-08 VITALS — BP 136/85 | HR 81 | Resp 18

## 2013-09-08 DIAGNOSIS — B070 Plantar wart: Secondary | ICD-10-CM

## 2013-09-08 NOTE — Progress Notes (Signed)
Subjective: Cheryl Franklin presents today for followup of excision of wart submetatarsal 4 left foot and for continued pain submetatarsal 4 of the left foot. She states that removing the lesion was not helpful and she continues to have pain.   Objective: Neurovascular status is intact and unchanged. There is an area of dried blood and a well-circumscribed circular pattern where I removed the verruca sub-metatarsal 4 of the left foot. Concern for continued verruca lesion in this area is present as there is absence of skin tension lines noted.  Assessment: Verruca left foot   Plan: Debrided the dried crust from where the verruca was excised. Recommended she resume salicylic acid application to the foot. She will call if the lesion does not resolve with this treatment.

## 2013-09-08 NOTE — Patient Instructions (Signed)
Salicylic Acid topical gel, cream, lotion, solution What is this medicine? SALICYCLIC ACID (SAL i SIL ik AS id) breaks down layers of thick skin. It is used to treat common and plantar warts, psoriasis, calluses, and corns. It is also used to treat or to prevent acne. This medicine may be used for other purposes; ask your health care provider or pharmacist if you have questions. COMMON BRAND NAME(S): [place] Jabs, Clear Away Liquid, Compound W, Corn/Callus Remover, Dermarest Psoriasis Moisturizer, Dermarest Psoriasis Overnight Treatment, Dermarest Psoriasis Scalp Treatment, Dermarest Psoriasis Skin Treatment, DuoFilm Wart Remover, [place], Hydrisalic, [place], Neutrogena Acne Wash, [place], RE SA, SalAC, [employer], [place], [place], [place], [place] 2 in 1 Anti-Dandruff, [place], [place], Wart-Off What should I tell my health care provider before I take this medicine? They need to know if you have any of these conditions: -child with chickenpox, the flu, or other viral infection -kidney disease -liver disease -an unusual or allergic reaction to salicylic acid, other medicines, foods, dyes, or preservatives -pregnant or trying to get pregnant -breast-feeding How should I use this medicine? This medicine is for external use only. Follow the directions on the label. Do not apply to raw or irritated skin. Avoid getting medicine in your eyes, lips, nose, mouth, or other sensitive areas. Use this medicine at regular intervals. Do not use more often than directed. Talk to your pediatrician regarding the use of this medicine in children. Special care may be needed. This medicine is not approved for use in children under 27 years old. Overdosage: If you think you have taken too much of this medicine contact a poison control center or emergency room at once. NOTE: This medicine is only for you. Do not share this medicine with others. What if I miss a dose? If you miss a dose, use it as soon as you  can. If it is almost time for your next dose, use only that dose. Do not use double or extra doses. What may interact with this medicine? -medicines that change urine pH like ammonium chloride, sodium bicarbonate, and others -medicines that treat or prevent blood clots like warfarin -methotrexate -pyrazinamide -some medicines for diabetes -some medicines for gout -steroid medicines like prednisone or cortisone This list may not describe all possible interactions. Give your health care provider a list of all the medicines, herbs, non-prescription drugs, or dietary supplements you use. Also tell them if you smoke, drink alcohol, or use illegal drugs. Some items may interact with your medicine. What should I watch for while using this medicine? Tell your doctor is your symptoms do not get better or if they get worse. This medicine can make you more sensitive to the sun. Keep out of the sun. If you cannot avoid being in the sun, wear protective clothing and use sunscreen. Do not use sun lamps or tanning beds/booths. Use of this medicine in children under 12 years or in patients with kidney or liver disease may increase the risk of serious side effects. These patients should not use this medicine over large areas of skin. If you notice symptoms such as nausea, vomiting, dizziness, loss of hearing, ringing in the ears, unusual weakness or tiredness, fast or labored breathing, diarrhea, or confusion, stop using this medicine and contact your doctor or health care professional. What side effects may I notice from receiving this medicine? Side effects that you should report to your doctor or health care professional as soon as possible: -allergic reactions like skin rash, itching or hives, swelling of the face,  lips, or tongue Side effects that usually do not require medical attention (report to your doctor or health care professional if they continue or are bothersome): -skin irritation This list may not  describe all possible side effects. Call your doctor for medical advice about side effects. You may report side effects to FDA at 1-800-FDA-1088. Where should I keep my medicine? Keep out of the reach of children. Store at room temperature between 15 and 30 degrees C (59 and 86 degrees F). Do not freeze. Throw away any unused medicine after the expiration date. NOTE: This sheet is a summary. It may not cover all possible information. If you have questions about this medicine, talk to your doctor, pharmacist, or health care provider.  2015, Elsevier/Gold Standard. (2007-09-19 13:36:20)

## 2013-09-25 ENCOUNTER — Encounter: Payer: Self-pay | Admitting: Internal Medicine

## 2013-10-21 DIAGNOSIS — B079 Viral wart, unspecified: Secondary | ICD-10-CM | POA: Diagnosis not present

## 2013-10-21 DIAGNOSIS — L570 Actinic keratosis: Secondary | ICD-10-CM | POA: Diagnosis not present

## 2013-11-06 ENCOUNTER — Encounter: Payer: Self-pay | Admitting: Podiatrist

## 2013-11-12 DIAGNOSIS — B079 Viral wart, unspecified: Secondary | ICD-10-CM | POA: Diagnosis not present

## 2013-12-03 ENCOUNTER — Emergency Department (HOSPITAL_COMMUNITY): Payer: Medicare Other

## 2013-12-03 ENCOUNTER — Emergency Department (HOSPITAL_COMMUNITY)
Admission: EM | Admit: 2013-12-03 | Discharge: 2013-12-03 | Disposition: A | Discharge status: Home / Self Care | Payer: Medicare Other | Attending: Emergency Medicine | Admitting: Emergency Medicine

## 2013-12-03 ENCOUNTER — Encounter (HOSPITAL_COMMUNITY): Payer: Self-pay | Admitting: *Deleted

## 2013-12-03 DIAGNOSIS — R1011 Right upper quadrant pain: Secondary | ICD-10-CM | POA: Diagnosis not present

## 2013-12-03 DIAGNOSIS — R110 Nausea: Secondary | ICD-10-CM | POA: Diagnosis not present

## 2013-12-03 DIAGNOSIS — R1013 Epigastric pain: Secondary | ICD-10-CM | POA: Diagnosis not present

## 2013-12-03 DIAGNOSIS — M545 Low back pain, unspecified: Secondary | ICD-10-CM

## 2013-12-03 DIAGNOSIS — R011 Cardiac murmur, unspecified: Secondary | ICD-10-CM | POA: Insufficient documentation

## 2013-12-03 DIAGNOSIS — K5790 Diverticulosis of intestine, part unspecified, without perforation or abscess without bleeding: Secondary | ICD-10-CM | POA: Diagnosis not present

## 2013-12-03 DIAGNOSIS — R109 Unspecified abdominal pain: Secondary | ICD-10-CM

## 2013-12-03 DIAGNOSIS — R5383 Other fatigue: Secondary | ICD-10-CM | POA: Insufficient documentation

## 2013-12-03 DIAGNOSIS — K8020 Calculus of gallbladder without cholecystitis without obstruction: Secondary | ICD-10-CM | POA: Diagnosis not present

## 2013-12-03 DIAGNOSIS — R1032 Left lower quadrant pain: Secondary | ICD-10-CM

## 2013-12-03 DIAGNOSIS — R1084 Generalized abdominal pain: Secondary | ICD-10-CM

## 2013-12-03 DIAGNOSIS — K5900 Constipation, unspecified: Secondary | ICD-10-CM | POA: Diagnosis not present

## 2013-12-03 DIAGNOSIS — M1990 Unspecified osteoarthritis, unspecified site: Secondary | ICD-10-CM | POA: Diagnosis not present

## 2013-12-03 LAB — URINALYSIS, ROUTINE W REFLEX MICROSCOPIC
BILIRUBIN URINE: NEGATIVE
GLUCOSE, UA: NEGATIVE mg/dL
HGB URINE DIPSTICK: NEGATIVE
KETONES UR: 15 mg/dL — AB
Leukocytes, UA: NEGATIVE
NITRITE: NEGATIVE
PH: 6.5 (ref 5.0–8.0)
Protein, ur: NEGATIVE mg/dL
SPECIFIC GRAVITY, URINE: 1.009 (ref 1.005–1.030)
Urobilinogen, UA: 0.2 mg/dL (ref 0.0–1.0)

## 2013-12-03 LAB — CBC WITH DIFFERENTIAL/PLATELET
Basophils Absolute: 0 10*3/uL (ref 0.0–0.1)
Basophils Relative: 0 % (ref 0–1)
EOS PCT: 1 % (ref 0–5)
Eosinophils Absolute: 0.1 10*3/uL (ref 0.0–0.7)
HEMATOCRIT: 39.3 % (ref 36.0–46.0)
Hemoglobin: 13.3 g/dL (ref 12.0–15.0)
LYMPHS ABS: 2.4 10*3/uL (ref 0.7–4.0)
LYMPHS PCT: 37 % (ref 12–46)
MCH: 31.2 pg (ref 26.0–34.0)
MCHC: 33.8 g/dL (ref 30.0–36.0)
MCV: 92.3 fL (ref 78.0–100.0)
Monocytes Absolute: 0.5 10*3/uL (ref 0.1–1.0)
Monocytes Relative: 7 % (ref 3–12)
Neutro Abs: 3.6 10*3/uL (ref 1.7–7.7)
Neutrophils Relative %: 55 % (ref 43–77)
PLATELETS: 233 10*3/uL (ref 150–400)
RBC: 4.26 MIL/uL (ref 3.87–5.11)
RDW: 13.3 % (ref 11.5–15.5)
WBC: 6.6 10*3/uL (ref 4.0–10.5)

## 2013-12-03 LAB — COMPREHENSIVE METABOLIC PANEL
ALBUMIN: 3.7 g/dL (ref 3.5–5.2)
ALT: 11 U/L (ref 0–35)
AST: 15 U/L (ref 0–37)
Alkaline Phosphatase: 94 U/L (ref 39–117)
Anion gap: 12 (ref 5–15)
BUN: 9 mg/dL (ref 6–23)
CALCIUM: 9.5 mg/dL (ref 8.4–10.5)
CO2: 26 mEq/L (ref 19–32)
CREATININE: 0.65 mg/dL (ref 0.50–1.10)
Chloride: 100 mEq/L (ref 96–112)
GFR calc Af Amer: >90 mL/min (ref >90)
GFR, EST NON AFRICAN AMERICAN: 87 mL/min — AB (ref >90)
Glucose, Bld: 92 mg/dL (ref 70–99)
Potassium: 4.1 mEq/L (ref 3.7–5.3)
Sodium: 138 mEq/L (ref 137–147)
TOTAL PROTEIN: 7.4 g/dL (ref 6.0–8.3)
Total Bilirubin: 0.4 mg/dL (ref 0.3–1.2)

## 2013-12-03 LAB — LIPASE, BLOOD: LIPASE: 40 U/L (ref 11–59)

## 2013-12-03 MED ORDER — FENTANYL CITRATE 0.05 MG/ML IJ SOLN
50.0000 ug | Freq: Once | INTRAMUSCULAR | Status: AC
  Administered 2013-12-03: 50 ug via INTRAVENOUS
  Filled 2013-12-03: qty 2

## 2013-12-03 MED ORDER — SODIUM CHLORIDE 0.9 % IV BOLUS (SEPSIS)
500.0000 mL | Freq: Once | INTRAVENOUS | Status: AC
  Administered 2013-12-03: 500 mL via INTRAVENOUS

## 2013-12-03 MED ORDER — IOHEXOL 300 MG/ML  SOLN
25.0000 mL | Freq: Once | INTRAMUSCULAR | Status: AC | PRN
  Administered 2013-12-03: 25 mL via ORAL

## 2013-12-03 MED ORDER — IOHEXOL 300 MG/ML  SOLN
100.0000 mL | Freq: Once | INTRAMUSCULAR | Status: AC | PRN
  Administered 2013-12-03: 100 mL via INTRAVENOUS

## 2013-12-03 MED ORDER — HYDROCODONE-ACETAMINOPHEN 5-325 MG PO TABS: 1.0000 | ORAL_TABLET | Freq: Four times a day (QID) | ORAL | Status: DC | PRN

## 2013-12-03 MED ORDER — POLYETHYLENE GLYCOL 3350 17 G PO PACK: 17.0000 g | PACK | Freq: Every day | ORAL | Status: DC

## 2013-12-03 MED ORDER — ONDANSETRON HCL 4 MG/2ML IJ SOLN
4.0000 mg | Freq: Once | INTRAMUSCULAR | Status: AC
  Administered 2013-12-03: 4 mg via INTRAVENOUS
  Filled 2013-12-03: qty 2

## 2013-12-03 NOTE — ED Provider Notes (Signed)
CSN: [id number]     Arrival date & time 12/03/13  38 History   First MD Initiated Contact with Patient 12/03/13 1605     Chief Complaint  Patient presents with  . Abdominal Pain   HPI  Patient is a 73 y.o. Female who presents to the ED via urgent care for LLQ pain, back pain, and suprapubic pain.  Patient states that she has been having LLQ pain, suprapubic pain, and back pain since Sunday.  She describes the pain as aching and bloating and is an 8/10 pain.  Patient has never had pain like this before.  Patient has found no aggravating factors.  She has tried taking tylenol, ibuprofen, ASA, and gas ex with no relief.  Patient states that she went to the UC who felt that she might have diverticulitis and then told her to come to the ED.  Patent denies diarrhea, melena, or bloody stools.  She has no history of diverticulitis.  Patient has had an appendectomy and has also had a vaginal hysterectomy.    Past Medical History  Diagnosis Date  . Allergy   . GERD (gastroesophageal reflux disease)   . OA (osteoarthritis)    Past Surgical History  Procedure Laterality Date  . Tubal ligation    . Appendectomy     Family History  Problem Relation Age of Onset  . Diabetes Other   . Stroke Other   . Heart disease Other   . Hypertension Other    History  Substance Use Topics  . Smoking status: Never Smoker   . Smokeless tobacco: Not on file  . Alcohol Use: No   OB History    No data available     Review of Systems  Constitutional: Positive for appetite change and fatigue. Negative for fever and chills.  Respiratory: Negative for chest tightness and shortness of breath.   Cardiovascular: Negative for chest pain, palpitations and leg swelling.  Gastrointestinal: Positive for nausea, abdominal pain and constipation. Negative for vomiting, diarrhea, blood in stool, anal bleeding and rectal pain.  Genitourinary: Negative for dysuria, urgency, frequency, hematuria, decreased urine volume and  difficulty urinating.  Musculoskeletal: Positive for back pain.  All other systems reviewed and are negative.     Allergies  Review of patient's allergies indicates no known allergies.  Home Medications   Prior to Admission medications   Medication Sig Start Date End Date Taking? Authorizing Provider  aspirin 325 MG tablet Take 325 mg by mouth every 4 (four) hours as needed for mild pain.   Yes Historical Provider, MD  ibuprofen (ADVIL,MOTRIN) 200 MG tablet Take 200 mg by mouth every 6 (six) hours as needed for mild pain.   Yes Historical Provider, MD  simethicone (MYLICON) 80 MG chewable tablet Chew 80 mg by mouth every 6 (six) hours as needed for flatulence.   Yes Historical Provider, MD  HYDROcodone-acetaminophen (NORCO/VICODIN) 5-325 MG per tablet Take 1 tablet by mouth every 6 (six) hours as needed for moderate pain or severe pain. 12/03/13   Rivkah Wolz A Forcucci, PA-C  polyethylene glycol (MIRALAX / GLYCOLAX) packet Take 17 g by mouth daily. 12/03/13   Aarvi Stotts A Forcucci, PA-C   BP 146/62 mmHg  Pulse 72  Temp(Src) 97.5 F (36.4 C) (Oral)  Resp 20  SpO2 98% Physical Exam  Constitutional: She is oriented to person, place, and time. She appears well-developed and well-nourished. No distress.  HENT:  Head: Normocephalic and atraumatic.  Mouth/Throat: Oropharynx is clear and moist. No oropharyngeal exudate.  Eyes: Conjunctivae and EOM are normal. Pupils are equal, round, and reactive to light. No scleral icterus.  Neck: Normal range of motion. Neck supple. No JVD present. No thyromegaly present.  Cardiovascular: Normal rate, regular rhythm and intact distal pulses.  Exam reveals no friction rub.   Murmur (soft systolic heard best over the 2nd right IC space) heard. Pulmonary/Chest: Effort normal and breath sounds normal. No respiratory distress. She has no wheezes. She has no rales. She exhibits no tenderness.  Abdominal: Soft. Normal appearance and bowel sounds are normal. She  exhibits no distension and no mass. There is tenderness in the right upper quadrant, epigastric area and suprapubic area. There is CVA tenderness (left sided). There is no rigidity, no rebound, no guarding, no tenderness at McBurney's point and negative Murphy's sign.  Musculoskeletal: Normal range of motion.  Lymphadenopathy:    She has no cervical adenopathy.  Neurological: She is alert and oriented to person, place, and time. She has normal strength. No cranial nerve deficit or sensory deficit. Coordination normal.  Skin: Skin is warm and dry. She is not diaphoretic.  Psychiatric: She has a normal mood and affect. Her behavior is normal. Judgment and thought content normal.  Nursing note and vitals reviewed.   ED Course  Procedures (including critical care time) Labs Review Labs Reviewed  COMPREHENSIVE METABOLIC PANEL - Abnormal; Notable for the following:    GFR calc non Af Amer 87 (*)    All other components within normal limits  URINALYSIS, ROUTINE W REFLEX MICROSCOPIC - Abnormal; Notable for the following:    APPearance CLOUDY (*)    Ketones, ur 15 (*)    All other components within normal limits  CBC WITH DIFFERENTIAL  LIPASE, BLOOD    Imaging Review Ct Abdomen Pelvis W Contrast  12/03/2013   CLINICAL DATA:  Generalized abdominal pain.  EXAM: CT ABDOMEN AND PELVIS WITH CONTRAST  TECHNIQUE: Multidetector CT imaging of the abdomen and pelvis was performed using the standard protocol following bolus administration of intravenous contrast.  CONTRAST:  119mL OMNIPAQUE IOHEXOL 300 MG/ML  SOLN  COMPARISON:  None.  FINDINGS: BODY WALL: Unremarkable.  LOWER CHEST: Unremarkable.  ABDOMEN/PELVIS:  Liver: Sub cm, too small to characterize low-density in the central right liver. Prominent caudate lobe.  Biliary: Cholelithiasis. The gallbladder is not distended and there is no pericholecystic inflammatory change.  Pancreas: Unremarkable.  Spleen: Unremarkable.  Adrenals: Unremarkable.  Kidneys  and ureters: No hydronephrosis or stone.  Bladder: Unremarkable.  Reproductive: Low pelvic floor consistent with laxity. Hysterectomy. Negative adnexae.  Bowel: Extensive sigmoid diverticulosis. No bowel obstruction. The gastric antrum appears somewhat thickened on the portal venous phase, but this appearance is not persistent into the delayed phase. There is a small outpouching from the inferior wall of the distal stomach, but not discrete/definitive for ulceration.  Retroperitoneum: No mass or adenopathy.  Peritoneum: No ascites or pneumoperitoneum.  Vascular: No acute abnormality.  OSSEOUS: No acute abnormalities. 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  IMPRESSION: 1. No definitive cause of abdominal pain. 2. Cholelithiasis. 3. Colonic diverticulosis.   Electronically Signed   By: Jorje Guild M.D.   On: 12/03/2013 19:09     EKG Interpretation None      MDM   Final diagnoses:  Abdominal pain  Left lower quadrant pain  Midline low back pain without sciatica   Patient is a 73 y.o. Female who presents to the ED with abdominal pain and back pain.  Physical exam reveals and  alert and non-toxic appearing female with no evidence of guarding or rigidity.  Patient does have left CVA tenderness, epigastric, and suprapubic tenderness.  CBC, CMP, and Lipase  Unremarkable.  UA negative.  CT of the abdomen and pelvis is negative at this time for any acute abnormalities.  There is no evidence for diverticulitis, but there is diverticulosis.  Will discharge the patient home at this time with a short course of hydrocodone 5/325 and will have the patient follow-up with her PCP.  I have recommended miralax for constipation due to the medication.  Patient has been seen by and discussed with Dr. Canary Brim who agrees with the above plan and discharge.  Patient states understanding and agreement at this time.  Patient to return for intractable pain, intractable nausea and vomiting, or rectal bleeding.      Cherylann Parr, PA-C 12/03/13 [place], MD 12/03/13 [phone]

## 2013-12-03 NOTE — ED Notes (Signed)
Pt in c/o left sided abd pain and lower back pain, states this has been going on since Saturday, denies n/v/d, sent here for further evaluation

## 2013-12-03 NOTE — Discharge Instructions (Signed)
Abdominal Pain, Women °Abdominal (stomach, pelvic, or belly) pain can be caused by many things. It is important to tell your doctor: °· The location of the pain. °· Does it come and go or is it present all the time? °· Are there things that start the pain (eating certain foods, exercise)? °· Are there other symptoms associated with the pain (fever, nausea, vomiting, diarrhea)? °All of this is helpful to know when trying to find the cause of the pain. °CAUSES  °· Stomach: virus or bacteria infection, or ulcer. °· Intestine: appendicitis (inflamed appendix), regional ileitis (Crohn's disease), ulcerative colitis (inflamed colon), irritable bowel syndrome, diverticulitis (inflamed diverticulum of the colon), or cancer of the stomach or intestine. °· Gallbladder disease or stones in the gallbladder. °· Kidney disease, kidney stones, or infection. °· Pancreas infection or cancer. °· Fibromyalgia (pain disorder). °· Diseases of the female organs: °¨ Uterus: fibroid (non-cancerous) tumors or infection. °¨ Fallopian tubes: infection or tubal pregnancy. °¨ Ovary: cysts or tumors. °¨ Pelvic adhesions (scar tissue). °¨ Endometriosis (uterus lining tissue growing in the pelvis and on the pelvic organs). °¨ Pelvic congestion syndrome (female organs filling up with blood just before the menstrual period). °¨ Pain with the menstrual period. °¨ Pain with ovulation (producing an egg). °¨ Pain with an IUD (intrauterine device, birth control) in the uterus. °¨ Cancer of the female organs. °· Functional pain (pain not caused by a disease, may improve without treatment). °· Psychological pain. °· Depression. °DIAGNOSIS  °Your doctor will decide the seriousness of your pain by doing an examination. °· Blood tests. °· X-rays. °· Ultrasound. °· CT scan (computed tomography, special type of X-ray). °· MRI (magnetic resonance imaging). °· Cultures, for infection. °· Barium enema (dye inserted in the large intestine, to better view it with  X-rays). °· Colonoscopy (looking in intestine with a lighted tube). °· Laparoscopy (minor surgery, looking in abdomen with a lighted tube). °· Major abdominal exploratory surgery (looking in abdomen with a large incision). °TREATMENT  °The treatment will depend on the cause of the pain.  °· Many cases can be observed and treated at home. °· Over-the-counter medicines recommended by your caregiver. °· Prescription medicine. °· Antibiotics, for infection. °· Birth control pills, for painful periods or for ovulation pain. °· Hormone treatment, for endometriosis. °· Nerve blocking injections. °· Physical therapy. °· Antidepressants. °· Counseling with a psychologist or psychiatrist. °· Minor or major surgery. °HOME CARE INSTRUCTIONS  °· Do not take laxatives, unless directed by your caregiver. °· Take over-the-counter pain medicine only if ordered by your caregiver. Do not take aspirin because it can cause an upset stomach or bleeding. °· Try a clear liquid diet (broth or water) as ordered by your caregiver. Slowly move to a bland diet, as tolerated, if the pain is related to the stomach or intestine. °· Have a thermometer and take your temperature several times a day, and record it. °· Bed rest and sleep, if it helps the pain. °· Avoid sexual intercourse, if it causes pain. °· Avoid stressful situations. °· Keep your follow-up appointments and tests, as your caregiver orders. °· If the pain does not go away with medicine or surgery, you may try: °¨ Acupuncture. °¨ Relaxation exercises (yoga, meditation). °¨ Group therapy. °¨ Counseling. °SEEK MEDICAL CARE IF:  °· You notice certain foods cause stomach pain. °· Your home care treatment is not helping your pain. °· You need stronger pain medicine. °· You want your IUD removed. °· You feel faint or   lightheaded. °· You develop nausea and vomiting. °· You develop a rash. °· You are having side effects or an allergy to your medicine. °SEEK IMMEDIATE MEDICAL CARE IF:  °· Your  pain does not go away or gets worse. °· You have a fever. °· Your pain is felt only in portions of the abdomen. The right side could possibly be appendicitis. The left lower portion of the abdomen could be colitis or diverticulitis. °· You are passing blood in your stools (bright red or black tarry stools, with or without vomiting). °· You have blood in your urine. °· You develop chills, with or without a fever. °· You pass out. °MAKE SURE YOU:  °· Understand these instructions. °· Will watch your condition. °· Will get help right away if you are not doing well or get worse. °Document Released: 11/12/2006 Document Revised: 06/01/2013 Document Reviewed: 12/02/2008 °Exit[employer]® Patient Information ©2015 [employer]. This information is not intended to replace advice given to you by your health care provider. Make sure you discuss any questions you have with your health care provider. ° °

## 2013-12-03 NOTE — ED Notes (Signed)
Patient finished with PO contrast. 

## 2013-12-03 NOTE — ED Notes (Signed)
Pt. Refused wheelchair 

## 2013-12-03 NOTE — ED Notes (Signed)
Patient transported to CT 

## 2013-12-08 ENCOUNTER — Ambulatory Visit: Payer: Medicare Other | Admitting: Internal Medicine

## 2013-12-08 ENCOUNTER — Encounter: Payer: Self-pay | Admitting: Family Medicine

## 2013-12-08 ENCOUNTER — Ambulatory Visit (INDEPENDENT_AMBULATORY_CARE_PROVIDER_SITE_OTHER): Payer: Medicare Other | Admitting: Family Medicine

## 2013-12-08 VITALS — BP 120/80 | Temp 98.2°F | Wt 139.0 lb

## 2013-12-08 DIAGNOSIS — R109 Unspecified abdominal pain: Secondary | ICD-10-CM | POA: Insufficient documentation

## 2013-12-08 DIAGNOSIS — R1033 Periumbilical pain: Secondary | ICD-10-CM | POA: Diagnosis not present

## 2013-12-08 DIAGNOSIS — Z23 Encounter for immunization: Secondary | ICD-10-CM

## 2013-12-08 NOTE — Progress Notes (Signed)
Pre visit review using our clinic review tool, if applicable. No additional management support is needed unless otherwise documented below in the visit note. 

## 2013-12-08 NOTE — Progress Notes (Signed)
   Subjective:    Patient ID: Cheryl Franklin, female    DOB: 28-Feb-1940, 73 y.o.   MRN: [id number]  HPI Cheryl Franklin is a 73 year old female who comes in today for follow-up of abdominal pain  She states on November 1 in the evening she took 3 Motrin because of her back pain. Couple hours later she developed some midepigastric abdominal pain. It's been hurting since that time. On November 5 she went to an urgent care and they subsequently sent her to the emergency room. Lab studies there were normal CT scan of her abdomen was normal except for some diverticulosis and some gallstones. She's not had any right upper quadrant abdominal pai The emergency room send her home with Vicodin 53 25 which she's been taking 1 or 2 pills a day. She's also had take MiraLAX because of constipation from the pain medication  She's had no fever, nausea, vomiting, no urinary tract symptoms. She's never had abdominal pain like this in the past.  She's due for a colonoscopy this fall with Dr. Maurene Franklin   Review of Systems Review of systems otherwise negative    Objective:   Physical Exam  Well-developed well-nourished thin female no acute distress vital signs stable she is afebrile examination the abdomen the abdomen is soft the bowel sounds are normal she is diffusely tender and no rebound or masses      Assessment & Plan:  A diffuse abdominal pain etiology unknown.......... We will refer back to Dr. Maurene Franklin for further evaluation to help determine the diagnosis.

## 2013-12-08 NOTE — Patient Instructions (Signed)
Avoid caffeine alcohol and aspirin  Prilosec 20 mg twice daily  Call and make an appointment to see Dr. Maurene Capes in the next couple days.

## 2013-12-11 ENCOUNTER — Telehealth: Payer: Self-pay | Admitting: *Deleted

## 2013-12-11 NOTE — Telephone Encounter (Signed)
Yes, she ought to be seen before colonoscopy because she may need an EGD or HIDA scan for her gall bladder.Please, obtain prior colonoscopy report, it is not in the chart.

## 2013-12-11 NOTE — Telephone Encounter (Signed)
I spoke with the pt- she states she is no longer having any abdominal pain.  She wants to keep her PV and procedure date as scheduled.  I told her to call if pain reoccurs before PV and understanding voiced

## 2013-12-11 NOTE — Telephone Encounter (Signed)
Dr. Olevia Perches,  This pt has a PV on 12-15-13 for her colonoscopy on 12-30-13.  While getting her chart ready, I noted she had an OV with her family MD on 12-08-13 and c/o abdominal pain.  She has been seen at Urgent Care for this.  I wanted to see if you wanted to proceed with a direct colonoscopy or if you wanted her to have an OV with you or an extender, if she is still having this pain when she comes into her PV.  Please advise.  Thanks, [employer]

## 2013-12-15 ENCOUNTER — Ambulatory Visit (AMBULATORY_SURGERY_CENTER): Payer: Self-pay | Admitting: *Deleted

## 2013-12-15 VITALS — Ht 64.0 in | Wt 139.6 lb

## 2013-12-15 DIAGNOSIS — Z1211 Encounter for screening for malignant neoplasm of colon: Secondary | ICD-10-CM

## 2013-12-15 MED ORDER — MOVIPREP 100 G PO SOLR: ORAL | Status: DC

## 2013-12-15 NOTE — Progress Notes (Signed)
No egg or soy allergy  No anesthesia or intubation problems per pt  No diet medications taken  See phone note from 12-11-13- pt states she is still not having any abdominal pain.  Is taking a 2 week course of Miralax but almost done with that

## 2013-12-16 DIAGNOSIS — H43393 Other vitreous opacities, bilateral: Secondary | ICD-10-CM | POA: Diagnosis not present

## 2013-12-16 DIAGNOSIS — H2513 Age-related nuclear cataract, bilateral: Secondary | ICD-10-CM | POA: Diagnosis not present

## 2013-12-30 ENCOUNTER — Encounter: Payer: Self-pay | Admitting: Internal Medicine

## 2013-12-30 ENCOUNTER — Ambulatory Visit (AMBULATORY_SURGERY_CENTER): Payer: Medicare Other | Admitting: Internal Medicine

## 2013-12-30 VITALS — BP 128/77 | HR 64 | Temp 97.2°F | Resp 16 | Ht 64.0 in | Wt 139.0 lb

## 2013-12-30 DIAGNOSIS — R109 Unspecified abdominal pain: Secondary | ICD-10-CM | POA: Diagnosis not present

## 2013-12-30 DIAGNOSIS — Z1211 Encounter for screening for malignant neoplasm of colon: Secondary | ICD-10-CM

## 2013-12-30 DIAGNOSIS — K219 Gastro-esophageal reflux disease without esophagitis: Secondary | ICD-10-CM | POA: Diagnosis not present

## 2013-12-30 DIAGNOSIS — F959 Tic disorder, unspecified: Secondary | ICD-10-CM | POA: Diagnosis not present

## 2013-12-30 MED ORDER — SODIUM CHLORIDE 0.9 % IV SOLN: 500.0000 mL | INTRAVENOUS | Status: DC

## 2013-12-30 NOTE — Progress Notes (Signed)
Stable to RR 

## 2013-12-30 NOTE — Patient Instructions (Signed)

## 2013-12-30 NOTE — Op Note (Signed)
[facility]  [address]. [place], [address]   COLONOSCOPY PROCEDURE REPORT  PATIENT: Cheryl Franklin, Cheryl Franklin  MR#: [id number] BIRTHDATE: 04-15-1940 , 73  yrs. old GENDER: female ENDOSCOPIST: Lafayette Dragon, MD REFERRED HW:[id number] Delora Fuel, M.D. PROCEDURE DATE:  12/30/2013 PROCEDURE:   Colonoscopy, screening First Screening Colonoscopy - Avg.  risk and is 50 yrs.  old or older - No.  Prior Negative Screening - Now for repeat screening. 10 or more years since last screening  History of Adenoma - Now for follow-up colonoscopy & has been > or = to 3 yrs.  N/A  Polyps Removed Today? No.  Polyps Removed Today? No.  Recommend repeat exam, <10 yrs? Polyps Removed Today? No.  Recommend repeat exam, <10 yrs? No. ASA CLASS:   Class II INDICATIONS:average risk for colon cancer and prior colonoscopy in October 2005 showed diverticulosis of the sigmoid colon. MEDICATIONS: Monitored anesthesia care and Propofol 200 mg IV  DESCRIPTION OF PROCEDURE:   After the risks benefits and alternatives of the procedure were thoroughly explained, informed consent was obtained.  The digital rectal exam revealed no abnormalities of the rectum.   The [id number]  endoscope was introduced through the anus and advanced to the cecum, which was identified by both the appendix and ileocecal valve. No adverse events experienced.   The quality of the prep was excellent, using MoviPrep  The instrument was then slowly withdrawn as the colon was fully examined.      COLON FINDINGS: There was moderate diverticulosis noted throughout the entire examined colon with associated muscular hypertrophy, luminal narrowing, angulation and tortuosity.  Retroflexed views revealed no abnormalities. The time to cecum=6 minutes 27 seconds. Withdrawal time=6 minutes 10 seconds.  The scope was withdrawn and the procedure completed. COMPLICATIONS: There were no immediate complications.  ENDOSCOPIC IMPRESSION: There  was moderate diverticulosis noted throughout the entire examined colon  RECOMMENDATIONS: High-fiber diet no recall due to age ( 10 years)  eSigned:  Lafayette Dragon, MD 12/30/2013 4:31 PM   cc:

## 2013-12-31 ENCOUNTER — Telehealth: Payer: Self-pay | Admitting: *Deleted

## 2013-12-31 NOTE — Telephone Encounter (Signed)
  Follow up Call-  Call back number 12/30/2013  Post procedure Call Back phone  # [phone]  Permission to leave phone message Yes     Patient questions:  Do you have a fever, pain , or abdominal swelling? No. Pain Score  0 *  Have you tolerated food without any problems? Yes.    Have you been able to return to your normal activities? Yes.    Do you have any questions about your discharge instructions: Diet   No. Medications  No. Follow up visit  No.  Do you have questions or concerns about your Care? No.  Actions: * If pain score is 4 or above: No action needed, pain <4.

## 2014-01-25 ENCOUNTER — Other Ambulatory Visit (INDEPENDENT_AMBULATORY_CARE_PROVIDER_SITE_OTHER): Payer: Medicare Other

## 2014-01-25 DIAGNOSIS — R1084 Generalized abdominal pain: Secondary | ICD-10-CM | POA: Diagnosis not present

## 2014-01-25 DIAGNOSIS — M810 Age-related osteoporosis without current pathological fracture: Secondary | ICD-10-CM | POA: Diagnosis not present

## 2014-01-25 DIAGNOSIS — Z0000 Encounter for general adult medical examination without abnormal findings: Secondary | ICD-10-CM | POA: Diagnosis not present

## 2014-01-25 LAB — POCT URINALYSIS DIPSTICK
BILIRUBIN UA: NEGATIVE
Glucose, UA: NEGATIVE
Ketones, UA: NEGATIVE
Leukocytes, UA: NEGATIVE
NITRITE UA: NEGATIVE
Protein, UA: NEGATIVE
RBC UA: NEGATIVE
Spec Grav, UA: 1.02
Urobilinogen, UA: 0.2
pH, UA: 7

## 2014-01-25 LAB — COMPREHENSIVE METABOLIC PANEL
ALT: 17 U/L (ref 0–35)
AST: 21 U/L (ref 0–37)
Albumin: 3.8 g/dL (ref 3.5–5.2)
Alkaline Phosphatase: 79 U/L (ref 39–117)
BILIRUBIN TOTAL: 0.5 mg/dL (ref 0.2–1.2)
BUN: 12 mg/dL (ref 6–23)
CO2: 26 mEq/L (ref 19–32)
Calcium: 9 mg/dL (ref 8.4–10.5)
Chloride: 107 mEq/L (ref 96–112)
Creatinine, Ser: 0.7 mg/dL (ref 0.4–1.2)
GFR: 93.37 mL/min (ref >60.00)
GLUCOSE: 80 mg/dL (ref 70–99)
Potassium: 4 mEq/L (ref 3.5–5.1)
Sodium: 140 mEq/L (ref 135–145)
TOTAL PROTEIN: 7.2 g/dL (ref 6.0–8.3)

## 2014-01-25 LAB — TSH: TSH: 4.14 u[IU]/mL (ref 0.35–4.50)

## 2014-01-25 LAB — CBC WITH DIFFERENTIAL/PLATELET
BASOS PCT: 0.7 % (ref 0.0–3.0)
Basophils Absolute: 0 10*3/uL (ref 0.0–0.1)
EOS PCT: 4.5 % (ref 0.0–5.0)
Eosinophils Absolute: 0.2 10*3/uL (ref 0.0–0.7)
HCT: 39.8 % (ref 36.0–46.0)
HEMOGLOBIN: 13.1 g/dL (ref 12.0–15.0)
LYMPHS ABS: 1.9 10*3/uL (ref 0.7–4.0)
Lymphocytes Relative: 36.4 % (ref 12.0–46.0)
MCHC: 32.9 g/dL (ref 30.0–36.0)
MCV: 93.4 fl (ref 78.0–100.0)
Monocytes Absolute: 0.4 10*3/uL (ref 0.1–1.0)
Monocytes Relative: 6.8 % (ref 3.0–12.0)
Neutro Abs: 2.7 10*3/uL (ref 1.4–7.7)
Neutrophils Relative %: 51.6 % (ref 43.0–77.0)
Platelets: 245 10*3/uL (ref 150.0–400.0)
RBC: 4.26 Mil/uL (ref 3.87–5.11)
RDW: 13.8 % (ref 11.5–15.5)
WBC: 5.2 10*3/uL (ref 4.0–10.5)

## 2014-01-25 LAB — LIPID PANEL
CHOL/HDL RATIO: 3
Cholesterol: 206 mg/dL — ABNORMAL HIGH (ref 0–200)
HDL: 61.3 mg/dL (ref >39.00)
LDL Cholesterol: 130 mg/dL — ABNORMAL HIGH (ref 0–99)
NONHDL: 144.7
Triglycerides: 73 mg/dL (ref 0.0–149.0)
VLDL: 14.6 mg/dL (ref 0.0–40.0)

## 2014-02-01 ENCOUNTER — Encounter: Payer: Self-pay | Admitting: Family Medicine

## 2014-02-01 ENCOUNTER — Ambulatory Visit (INDEPENDENT_AMBULATORY_CARE_PROVIDER_SITE_OTHER): Payer: Medicare Other | Admitting: Family Medicine

## 2014-02-01 VITALS — BP 110/80 | Temp 98.1°F | Ht 64.0 in | Wt 141.0 lb

## 2014-02-01 DIAGNOSIS — Z23 Encounter for immunization: Secondary | ICD-10-CM | POA: Diagnosis not present

## 2014-02-01 DIAGNOSIS — M810 Age-related osteoporosis without current pathological fracture: Secondary | ICD-10-CM

## 2014-02-01 NOTE — Progress Notes (Signed)
Pre visit review using our clinic review tool, if applicable. No additional management support is needed unless otherwise documented below in the visit note. 

## 2014-02-01 NOTE — Progress Notes (Signed)
   Subjective:    Patient ID: Cheryl Franklin, female    DOB: 1940/08/25, 74 y.o.   MRN: [id number]  HPI Cheryl Franklin is a 74 year old female nonsmoker who comes in today for evaluation of osteoporosis  Her weight is steady at 141. We started on exercise program last year and she walks daily for 3 months and then quit walking. Again recommended she walk daily because of her history of osteoporosis  She gets routine eye care, dental care, BSE monthly, annual mammography, recent colonoscopy normal  Vaccinations updated today,,,,, Pneumovax 13  Cognitive function normal she does not exercise daily home health safety reviewed no issues identified, no guns in the house, she does not have a healthcare power of attorney nor living well encouraged to do so   Review of Systems  Constitutional: Negative.   HENT: Negative.   Eyes: Negative.   Respiratory: Negative.   Cardiovascular: Negative.   Gastrointestinal: Negative.   Endocrine: Negative.   Genitourinary: Negative.   Musculoskeletal: Negative.   Skin: Negative.   Allergic/Immunologic: Negative.   Neurological: Negative.   Hematological: Negative.   Psychiatric/Behavioral: Negative.        Objective:   Physical Exam  Constitutional: She appears well-developed and well-nourished.  HENT:  Head: Normocephalic and atraumatic.  Right Ear: External ear normal.  Left Ear: External ear normal.  Nose: Nose normal.  Mouth/Throat: Oropharynx is clear and moist.  Eyes: EOM are normal. Pupils are equal, round, and reactive to light.  Neck: Normal range of motion. Neck supple. No JVD present. No tracheal deviation present. No thyromegaly present.  Cardiovascular: Normal rate, regular rhythm, normal heart sounds and intact distal pulses.  Exam reveals no gallop and no friction rub.   No murmur heard. Pulmonary/Chest: Effort normal and breath sounds normal. No stridor. No respiratory distress. She has no wheezes. She has no rales. She exhibits no  tenderness.  Abdominal: Soft. Bowel sounds are normal. She exhibits no distension and no mass. There is no tenderness. There is no rebound and no guarding.  Genitourinary:  Bilateral breast exam normal  Musculoskeletal: Normal range of motion.  Lymphadenopathy:    She has no cervical adenopathy.  Neurological: She is alert. She has normal reflexes. No cranial nerve deficit. She exhibits normal muscle tone. Coordination normal.  Skin: Skin is warm and dry. No rash noted. No erythema. No pallor.  Total body skin exam normal  Psychiatric: She has a normal mood and affect. Her behavior is normal. Judgment and thought content normal.  Nursing note and vitals reviewed.         Assessment & Plan:  History of osteoporosis,,,,,,,, continue good diet and restart daily exercise

## 2014-02-01 NOTE — Patient Instructions (Signed)
Restart your daily exercise program walking 30 minutes daily  Return in one year sooner if any problems

## 2015-01-26 ENCOUNTER — Encounter: Payer: Self-pay | Admitting: *Deleted

## 2015-02-22 ENCOUNTER — Other Ambulatory Visit (INDEPENDENT_AMBULATORY_CARE_PROVIDER_SITE_OTHER): Payer: Medicare Other

## 2015-02-22 DIAGNOSIS — Z0000 Encounter for general adult medical examination without abnormal findings: Secondary | ICD-10-CM | POA: Diagnosis not present

## 2015-02-22 LAB — POCT URINALYSIS DIPSTICK
Bilirubin, UA: NEGATIVE
Blood, UA: NEGATIVE
Glucose, UA: NEGATIVE
Ketones, UA: NEGATIVE
LEUKOCYTES UA: NEGATIVE
NITRITE UA: NEGATIVE
PROTEIN UA: NEGATIVE
Spec Grav, UA: 1.02
Urobilinogen, UA: 0.2
pH, UA: 6

## 2015-02-22 LAB — CBC WITH DIFFERENTIAL/PLATELET
BASOS PCT: 0.5 % (ref 0.0–3.0)
Basophils Absolute: 0 10*3/uL (ref 0.0–0.1)
EOS ABS: 0.3 10*3/uL (ref 0.0–0.7)
Eosinophils Relative: 4.8 % (ref 0.0–5.0)
HCT: 41.2 % (ref 36.0–46.0)
HEMOGLOBIN: 13.6 g/dL (ref 12.0–15.0)
Lymphocytes Relative: 32 % (ref 12.0–46.0)
Lymphs Abs: 2.2 10*3/uL (ref 0.7–4.0)
MCHC: 32.9 g/dL (ref 30.0–36.0)
MCV: 92.5 fl (ref 78.0–100.0)
Monocytes Absolute: 0.4 10*3/uL (ref 0.1–1.0)
Monocytes Relative: 6.4 % (ref 3.0–12.0)
Neutro Abs: 3.8 10*3/uL (ref 1.4–7.7)
Neutrophils Relative %: 56.3 % (ref 43.0–77.0)
Platelets: 250 10*3/uL (ref 150.0–400.0)
RBC: 4.46 Mil/uL (ref 3.87–5.11)
RDW: 14.1 % (ref 11.5–15.5)
WBC: 6.8 10*3/uL (ref 4.0–10.5)

## 2015-02-22 LAB — BASIC METABOLIC PANEL
BUN: 12 mg/dL (ref 6–23)
CO2: 27 meq/L (ref 19–32)
Calcium: 9.3 mg/dL (ref 8.4–10.5)
Chloride: 104 mEq/L (ref 96–112)
Creatinine, Ser: 0.67 mg/dL (ref 0.40–1.20)
GFR: 91.49 mL/min (ref >60.00)
Glucose, Bld: 78 mg/dL (ref 70–99)
Potassium: 3.9 mEq/L (ref 3.5–5.1)
SODIUM: 140 meq/L (ref 135–145)

## 2015-02-22 LAB — TSH: TSH: 3.25 u[IU]/mL (ref 0.35–4.50)

## 2015-03-01 ENCOUNTER — Encounter: Payer: Self-pay | Admitting: Family Medicine

## 2015-03-01 ENCOUNTER — Other Ambulatory Visit: Payer: Self-pay | Admitting: Family Medicine

## 2015-03-01 ENCOUNTER — Ambulatory Visit (INDEPENDENT_AMBULATORY_CARE_PROVIDER_SITE_OTHER): Payer: Medicare Other | Admitting: Family Medicine

## 2015-03-01 VITALS — BP 124/80 | Temp 98.5°F | Ht 63.25 in | Wt 140.0 lb

## 2015-03-01 DIAGNOSIS — M810 Age-related osteoporosis without current pathological fracture: Secondary | ICD-10-CM

## 2015-03-01 DIAGNOSIS — Z0000 Encounter for general adult medical examination without abnormal findings: Secondary | ICD-10-CM | POA: Diagnosis not present

## 2015-03-01 DIAGNOSIS — I7789 Other specified disorders of arteries and arterioles: Secondary | ICD-10-CM | POA: Insufficient documentation

## 2015-03-01 NOTE — Patient Instructions (Signed)
Continue good health habits diet and exercise  Calcium 1200 mg,,,,,,,, vitamin D 800 mg daily for bone health  We'll get you set up for an ultrasound to be sure your aorta is normal  Follow-up in one year sooner if any problems,,,,,,,,, Corgard Almyra Free are 2 new adult nurse practitioner's or Dr. [place]

## 2015-03-01 NOTE — Progress Notes (Signed)
Pre visit review using our clinic review tool, if applicable. No additional management support is needed unless otherwise documented below in the visit note. 

## 2015-03-01 NOTE — Progress Notes (Signed)
   Subjective:    Patient ID: Cheryl Franklin, female    DOB: 09-Oct-1940, 75 y.o.   MRN: [id number]  HPI Cheryl Franklin is a 75 year old female nonsmoker who comes in today for general physical examination  She gets routine eye care, dental care, BSE sporadically, last mammogram was 2014. Encouraged annual mammography. Colonoscopy 2015 normal  She's due a shingles vaccine and a tetanus booster.  Cognitive functions normal she restart an exercise program recently, home health safety reviewed no issues identified, no guns in the house, she does not have a healthcare power of attorney nor living well. She was encouraged to get that. She has a son and daughter that lives here in [place].  She had a bone density 2014 showed some bone loss. She takes calcium vitamin D and walks daily.  She is postmenopausal no GYN complaints   Review of Systems  Constitutional: Negative.   HENT: Negative.   Eyes: Negative.   Respiratory: Negative.   Cardiovascular: Negative.   Gastrointestinal: Negative.   Endocrine: Negative.   Genitourinary: Negative.   Musculoskeletal: Negative.   Skin: Negative.   Allergic/Immunologic: Negative.   Neurological: Negative.   Hematological: Negative.   Psychiatric/Behavioral: Negative.        Objective:   Physical Exam  Constitutional: She appears well-developed and well-nourished.  HENT:  Head: Normocephalic and atraumatic.  Right Ear: External ear normal.  Left Ear: External ear normal.  Nose: Nose normal.  Mouth/Throat: Oropharynx is clear and moist.  Eyes: EOM are normal. Pupils are equal, round, and reactive to light.  Neck: Normal range of motion. Neck supple. No JVD present. No tracheal deviation present. No thyromegaly present.  Cardiovascular: Normal rate, regular rhythm, normal heart sounds and intact distal pulses.  Exam reveals no gallop and no friction rub.   No murmur heard. Pulmonary/Chest: Effort normal and breath sounds normal. No stridor. No  respiratory distress. She has no wheezes. She has no rales. She exhibits no tenderness.  Abdominal: Soft. Bowel sounds are normal. She exhibits no distension and no mass. There is no tenderness. There is no rebound and no guarding.  Prominent aorta  Genitourinary:  Bilateral breast exam normal  Musculoskeletal: Normal range of motion.  Lymphadenopathy:    She has no cervical adenopathy.  Neurological: She is alert. She has normal reflexes. No cranial nerve deficit. She exhibits normal muscle tone. Coordination normal.  Skin: Skin is warm and dry. No rash noted. No erythema. No pallor.  Psychiatric: She has a normal mood and affect. Her behavior is normal. Judgment and thought content normal.  Nursing note and vitals reviewed.         Assessment & Plan:  Postmenopausal osteoporosis........ continue exercise calcium vitamin D  Prominent aorta.......... ultrasound rule out AAA

## 2015-03-04 ENCOUNTER — Ambulatory Visit (HOSPITAL_COMMUNITY)
Admission: RE | Admit: 2015-03-04 | Discharge: 2015-03-04 | Disposition: A | Discharge status: Home / Self Care | Payer: Medicare Other | Source: Ambulatory Visit | Attending: Cardiovascular Disease | Admitting: Cardiovascular Disease

## 2015-03-04 DIAGNOSIS — I7789 Other specified disorders of arteries and arterioles: Secondary | ICD-10-CM | POA: Diagnosis not present

## 2015-03-04 DIAGNOSIS — I700 Atherosclerosis of aorta: Secondary | ICD-10-CM | POA: Insufficient documentation

## 2015-03-08 DIAGNOSIS — H43393 Other vitreous opacities, bilateral: Secondary | ICD-10-CM | POA: Diagnosis not present

## 2015-03-08 DIAGNOSIS — H2513 Age-related nuclear cataract, bilateral: Secondary | ICD-10-CM | POA: Diagnosis not present

## 2016-03-06 DIAGNOSIS — J069 Acute upper respiratory infection, unspecified: Secondary | ICD-10-CM | POA: Diagnosis not present

## 2016-05-22 ENCOUNTER — Telehealth: Payer: Self-pay | Admitting: Family Medicine

## 2016-06-07 ENCOUNTER — Other Ambulatory Visit: Payer: Medicare Other

## 2016-06-07 ENCOUNTER — Ambulatory Visit (INDEPENDENT_AMBULATORY_CARE_PROVIDER_SITE_OTHER): Payer: Medicare Other

## 2016-06-07 VITALS — BP 140/70 | HR 72 | Ht 64.0 in | Wt 141.6 lb

## 2016-06-07 DIAGNOSIS — Z0000 Encounter for general adult medical examination without abnormal findings: Secondary | ICD-10-CM | POA: Diagnosis not present

## 2016-06-07 NOTE — Patient Instructions (Addendum)
Cheryl Franklin , Thank you for taking time to come for your Medicare Wellness Visit. I appreciate your ongoing commitment to your health goals. Please review the following plan we discussed and let me know if I can assist you in the future.   Will check with Dr. Sherren Mocha regarding repeating the bone density when you scheduled your mammogram or other Both are due;  You can visit the [email or web address] online   A Tetanus is recommended every 10 years. Medicare covers a tetanus if you have a cut or wound; otherwise, there may be a charge. If you had not had a tetanus with pertusses, known as the Tdap, you can take this anytime.   To bring a copy of your HCPOA and Living will when complete so we can copy to the chart   Will have vision check soon  Summary: Preventive Care for Adults  A healthy lifestyle and preventive care can promote health and wellness. Preventive health guidelines for adults include the following key practices.  . A routine yearly physical is a good way to check with your health care provider about your health and preventive screening. It is a chance to share any concerns and updates on your health and to receive a thorough exam.  . Visit your dentist for a routine exam and preventive care every 6 months. Brush your teeth twice a day and floss once a day. Good oral hygiene prevents tooth decay and gum disease.  . The frequency of eye exams is based on your age, health, family medical history, use  of contact lenses, and other factors. Follow your health care provider's ecommendations for frequency of eye exams.  . Eat a healthy diet. Foods like vegetables, fruits, whole grains, low-fat dairy products, and lean protein foods contain the nutrients you need without too many calories. Decrease your intake of foods high in solid fats, added sugars, and salt. Eat the right amount of calories for you. Get information about a proper diet from your health care provider, if  necessary.  . Regular physical exercise is one of the most important things you can do for your health. Most adults should get at least 150 minutes of moderate-intensity exercise (any activity that increases your heart rate and causes you to sweat) each week. In addition, most adults need muscle-strengthening exercises on 2 or more days a week.  Silver Sneakers may be a benefit available to you. To determine eligibility, you may visit the website: www.silversneakers.com or contact program at 574-094-6777 Mon-Fri between 8AM-8PM.   . Maintain a healthy weight. The body mass index (BMI) is a screening tool to identify possible weight problems. It provides an estimate of body fat based on height and weight. Your health care provider can find your BMI and can help you achieve or maintain a healthy weight.   For adults 20 years and older: ? A BMI below 18.5 is considered underweight. ? A BMI of 18.5 to 24.9 is normal. ? A BMI of 27 to 28 is considered normal by the Institutes of Health  ? A BMI of 30 and above is considered obese.   . Maintain normal blood lipids and cholesterol levels by exercising and minimizing your intake of saturated fat. Eat a balanced diet with plenty of fruit and vegetables. Blood tests for lipids and cholesterol should begin at age 74 and be repeated every 5 years. If your lipid or cholesterol levels are high, you are over 50, or you are at high risk for heart  disease, you may need your cholesterol levels checked more frequently. Ongoing high lipid and cholesterol levels should be treated with medicines if diet and exercise are not working.  . If you smoke, find out from your health care provider how to quit. If you do not use tobacco, please do not start.  . If you choose to drink alcohol, please do not consume more than one drink for women and 2 for men.  One drink is considered to be 12 ounces (355 mL) of beer, 5 ounces (148 mL) of wine, or 1.5 ounces (44 mL) of  liquor. Moderation of alcohol intake to this level decreases your risk of breast cancer and liver damage.   . If you are 30-68 years old, ask your health care provider if you should take aspirin to prevent strokes.  . Use sunscreen. Apply sunscreen liberally and repeatedly throughout the day. You should seek shade when your shadow is shorter than you. Protect yourself by wearing long sleeves, pants, a wide-brimmed hat, and sunglasses year round, whenever you are outdoors.  . Once a month, do a whole body skin exam, using a mirror to look at the skin on your back. Tell your health care provider of new moles, moles that have irregular borders, moles that are larger than a pencil eraser, or moles that have changed in shape or color.  Last, if you have completed an Advanced Directive; please bring a copy and review with your physician and then we will scan to the medical record      These are the goals we discussed: Goals    . Exercise 150 minutes per week (moderate activity)          Keep walking 2 miles a day       This is a list of the screening recommended for you and due dates:  Health Maintenance  Topic Date Due  . Tetanus Vaccine  01/30/2015  . Flu Shot  08/29/2016  . Colon Cancer Screening  12/31/2023  . DEXA scan (bone density measurement)  Completed  . Pneumonia vaccines  Completed        Fall Prevention in the Home Falls can cause injuries. They can happen to people of all ages. There are many things you can do to make your home safe and to help prevent falls. What can I do on the outside of my home?  Regularly fix the edges of walkways and driveways and fix any cracks.  Remove anything that might make you trip as you walk through a door, such as a raised step or threshold.  Trim any bushes or trees on the path to your home.  Use bright outdoor lighting.  Clear any walking paths of anything that might make someone trip, such as rocks or tools.  Regularly  check to see if handrails are loose or broken. Make sure that both sides of any steps have handrails.  Any raised decks and porches should have guardrails on the edges.  Have any leaves, snow, or ice cleared regularly.  Use sand or salt on walking paths during winter.  Clean up any spills in your garage right away. This includes oil or grease spills. What can I do in the bathroom?  Use night lights.  Install grab bars by the toilet and in the tub and shower. Do not use towel bars as grab bars.  Use non-skid mats or decals in the tub or shower.  If you need to sit down in the shower, use  a plastic, non-slip stool.  Keep the floor dry. Clean up any water that spills on the floor as soon as it happens.  Remove soap buildup in the tub or shower regularly.  Attach bath mats securely with double-sided non-slip rug tape.  Do not have throw rugs and other things on the floor that can make you trip. What can I do in the bedroom?  Use night lights.  Make sure that you have a light by your bed that is easy to reach.  Do not use any sheets or blankets that are too big for your bed. They should not hang down onto the floor.  Have a firm chair that has side arms. You can use this for support while you get dressed.  Do not have throw rugs and other things on the floor that can make you trip. What can I do in the kitchen?  Clean up any spills right away.  Avoid walking on wet floors.  Keep items that you use a lot in easy-to-reach places.  If you need to reach something above you, use a strong step stool that has a grab bar.  Keep electrical cords out of the way.  Do not use floor polish or wax that makes floors slippery. If you must use wax, use non-skid floor wax.  Do not have throw rugs and other things on the floor that can make you trip. What can I do with my stairs?  Do not leave any items on the stairs.  Make sure that there are handrails on both sides of the stairs and  use them. Fix handrails that are broken or loose. Make sure that handrails are as long as the stairways.  Check any carpeting to make sure that it is firmly attached to the stairs. Fix any carpet that is loose or worn.  Avoid having throw rugs at the top or bottom of the stairs. If you do have throw rugs, attach them to the floor with carpet tape.  Make sure that you have a light switch at the top of the stairs and the bottom of the stairs. If you do not have them, ask someone to add them for you. What else can I do to help prevent falls?  Wear shoes that:  Do not have high heels.  Have rubber bottoms.  Are comfortable and fit you well.  Are closed at the toe. Do not wear sandals.  If you use a stepladder:  Make sure that it is fully opened. Do not climb a closed stepladder.  Make sure that both sides of the stepladder are locked into place.  Ask someone to hold it for you, if possible.  Clearly mark and make sure that you can see:  Any grab bars or handrails.  First and last steps.  Where the edge of each step is.  Use tools that help you move around (mobility aids) if they are needed. These include:  Canes.  Walkers.  Scooters.  Crutches.  Turn on the lights when you go into a dark area. Replace any light bulbs as soon as they burn out.  Set up your furniture so you have a clear path. Avoid moving your furniture around.  If any of your floors are uneven, fix them.  If there are any pets around you, be aware of where they are.  Review your medicines with your doctor. Some medicines can make you feel dizzy. This can increase your chance of falling. Ask your doctor what other  things that you can do to help prevent falls. This information is not intended to replace advice given to you by your health care provider. Make sure you discuss any questions you have with your health care provider. Document Released: 11/11/2008 Document Revised: 06/23/2015 Document  Reviewed: 02/19/2014 Elsevier Interactive Patient Education  2017 [employer] Maintenance, Female Adopting a healthy lifestyle and getting preventive care can go a long way to promote health and wellness. Talk with your health care provider about what schedule of regular examinations is right for you. This is a good chance for you to check in with your provider about disease prevention and staying healthy. In between checkups, there are plenty of things you can do on your own. Experts have done a lot of research about which lifestyle changes and preventive measures are most likely to keep you healthy. Ask your health care provider for more information. Weight and diet Eat a healthy diet  Be sure to include plenty of vegetables, fruits, low-fat dairy products, and lean protein.  Do not eat a lot of foods high in solid fats, added sugars, or salt.  Get regular exercise. This is one of the most important things you can do for your health.  Most adults should exercise for at least 150 minutes each week. The exercise should increase your heart rate and make you sweat (moderate-intensity exercise).  Most adults should also do strengthening exercises at least twice a week. This is in addition to the moderate-intensity exercise. Maintain a healthy weight  Body mass index (BMI) is a measurement that can be used to identify possible weight problems. It estimates body fat based on height and weight. Your health care provider can help determine your BMI and help you achieve or maintain a healthy weight.  For females 87 years of age and older:  A BMI below 18.5 is considered underweight.  A BMI of 18.5 to 24.9 is normal.  A BMI of 25 to 29.9 is considered overweight.  A BMI of 30 and above is considered obese. Watch levels of cholesterol and blood lipids  You should start having your blood tested for lipids and cholesterol at 75 years of age, then have this test every 5 years.  You  may need to have your cholesterol levels checked more often if:  Your lipid or cholesterol levels are high.  You are older than 75 years of age.  You are at high risk for heart disease. Cancer screening Lung Cancer  Lung cancer screening is recommended for adults 40-35 years old who are at high risk for lung cancer because of a history of smoking.  A yearly low-dose CT scan of the lungs is recommended for people who:  Currently smoke.  Have quit within the past 15 years.  Have at least a 30-pack-year history of smoking. A pack year is smoking an average of one pack of cigarettes a day for 1 year.  Yearly screening should continue until it has been 15 years since you quit.  Yearly screening should stop if you develop a health problem that would prevent you from having lung cancer treatment. Breast Cancer  Practice breast self-awareness. This means understanding how your breasts normally appear and feel.  It also means doing regular breast self-exams. Let your health care provider know about any changes, no matter how small.  If you are in your 20s or 30s, you should have a clinical breast exam (CBE) by a health care provider every 1-3 years as  part of a regular health exam.  If you are 40 or older, have a CBE every year. Also consider having a breast X-ray (mammogram) every year.  If you have a family history of breast cancer, talk to your health care provider about genetic screening.  If you are at high risk for breast cancer, talk to your health care provider about having an MRI and a mammogram every year.  Breast cancer gene (BRCA) assessment is recommended for women who have family members with BRCA-related cancers. BRCA-related cancers include:  Breast.  Ovarian.  Tubal.  Peritoneal cancers.  Results of the assessment will determine the need for genetic counseling and BRCA1 and BRCA2 testing. Cervical Cancer  Your health care provider may recommend that you be  screened regularly for cancer of the pelvic organs (ovaries, uterus, and vagina). This screening involves a pelvic examination, including checking for microscopic changes to the surface of your cervix (Pap test). You may be encouraged to have this screening done every 3 years, beginning at age 54.  For women ages 80-65, health care providers may recommend pelvic exams and Pap testing every 3 years, or they may recommend the Pap and pelvic exam, combined with testing for human papilloma virus (HPV), every 5 years. Some types of HPV increase your risk of cervical cancer. Testing for HPV may also be done on women of any age with unclear Pap test results.  Other health care providers may not recommend any screening for nonpregnant women who are considered low risk for pelvic cancer and who do not have symptoms. Ask your health care provider if a screening pelvic exam is right for you.  If you have had past treatment for cervical cancer or a condition that could lead to cancer, you need Pap tests and screening for cancer for at least 20 years after your treatment. If Pap tests have been discontinued, your risk factors (such as having a new sexual partner) need to be reassessed to determine if screening should resume. Some women have medical problems that increase the chance of getting cervical cancer. In these cases, your health care provider may recommend more frequent screening and Pap tests. Colorectal Cancer  This type of cancer can be detected and often prevented.  Routine colorectal cancer screening usually begins at 75 years of age and continues through 75 years of age.  Your health care provider may recommend screening at an earlier age if you have risk factors for colon cancer.  Your health care provider may also recommend using home test kits to check for hidden blood in the stool.  A small camera at the end of a tube can be used to examine your colon directly (sigmoidoscopy or colonoscopy).  This is done to check for the earliest forms of colorectal cancer.  Routine screening usually begins at age 28.  Direct examination of the colon should be repeated every 5-10 years through 75 years of age. However, you may need to be screened more often if early forms of precancerous polyps or small growths are found. Skin Cancer  Check your skin from head to toe regularly.  Tell your health care provider about any new moles or changes in moles, especially if there is a change in a mole's shape or color.  Also tell your health care provider if you have a mole that is larger than the size of a pencil eraser.  Always use sunscreen. Apply sunscreen liberally and repeatedly throughout the day.  Protect yourself by wearing long sleeves,  pants, a wide-brimmed hat, and sunglasses whenever you are outside. Heart disease, diabetes, and high blood pressure  High blood pressure causes heart disease and increases the risk of stroke. High blood pressure is more likely to develop in:  People who have blood pressure in the high end of the normal range (130-139/85-89 mm Hg).  People who are overweight or obese.  People who are African American.  If you are 1-89 years of age, have your blood pressure checked every 3-5 years. If you are 61 years of age or older, have your blood pressure checked every year. You should have your blood pressure measured twice-once when you are at a hospital or clinic, and once when you are not at a hospital or clinic. Record the average of the two measurements. To check your blood pressure when you are not at a hospital or clinic, you can use:  An automated blood pressure machine at a pharmacy.  A home blood pressure monitor.  If you are between 63 years and 2 years old, ask your health care provider if you should take aspirin to prevent strokes.  Have regular diabetes screenings. This involves taking a blood sample to check your fasting blood sugar level.  If you  are at a normal weight and have a low risk for diabetes, have this test once every three years after 75 years of age.  If you are overweight and have a high risk for diabetes, consider being tested at a younger age or more often. Preventing infection Hepatitis B  If you have a higher risk for hepatitis B, you should be screened for this virus. You are considered at high risk for hepatitis B if:  You were born in a country where hepatitis B is common. Ask your health care provider which countries are considered high risk.  Your parents were born in a high-risk country, and you have not been immunized against hepatitis B (hepatitis B vaccine).  You have HIV or AIDS.  You use needles to inject street drugs.  You live with someone who has hepatitis B.  You have had sex with someone who has hepatitis B.  You get hemodialysis treatment.  You take certain medicines for conditions, including cancer, organ transplantation, and autoimmune conditions. Hepatitis C  Blood testing is recommended for:  Everyone born from [birth year] through 1964.  Anyone with known risk factors for hepatitis C. Sexually transmitted infections (STIs)  You should be screened for sexually transmitted infections (STIs) including gonorrhea and chlamydia if:  You are sexually active and are younger than 75 years of age.  You are older than 75 years of age and your health care provider tells you that you are at risk for this type of infection.  Your sexual activity has changed since you were last screened and you are at an increased risk for chlamydia or gonorrhea. Ask your health care provider if you are at risk.  If you do not have HIV, but are at risk, it may be recommended that you take a prescription medicine daily to prevent HIV infection. This is called pre-exposure prophylaxis (PrEP). You are considered at risk if:  You are sexually active and do not regularly use condoms or know the HIV status of your  partner(s).  You take drugs by injection.  You are sexually active with a partner who has HIV. Talk with your health care provider about whether you are at high risk of being infected with HIV. If you choose to begin PrEP,  you should first be tested for HIV. You should then be tested every 3 months for as long as you are taking PrEP. Pregnancy  If you are premenopausal and you may become pregnant, ask your health care provider about preconception counseling.  If you may become pregnant, take 400 to 800 micrograms (mcg) of folic acid every day.  If you want to prevent pregnancy, talk to your health care provider about birth control (contraception). Osteoporosis and menopause  Osteoporosis is a disease in which the bones lose minerals and strength with aging. This can result in serious bone fractures. Your risk for osteoporosis can be identified using a bone density scan.  If you are 79 years of age or older, or if you are at risk for osteoporosis and fractures, ask your health care provider if you should be screened.  Ask your health care provider whether you should take a calcium or vitamin D supplement to lower your risk for osteoporosis.  Menopause may have certain physical symptoms and risks.  Hormone replacement therapy may reduce some of these symptoms and risks. Talk to your health care provider about whether hormone replacement therapy is right for you. Follow these instructions at home:  Schedule regular health, dental, and eye exams.  Stay current with your immunizations.  Do not use any tobacco products including cigarettes, chewing tobacco, or electronic cigarettes.  If you are pregnant, do not drink alcohol.  If you are breastfeeding, limit how much and how often you drink alcohol.  Limit alcohol intake to no more than 1 drink per day for nonpregnant women. One drink equals 12 ounces of beer, 5 ounces of wine, or 1 ounces of hard liquor.  Do not use street  drugs.  Do not share needles.  Ask your health care provider for help if you need support or information about quitting drugs.  Tell your health care provider if you often feel depressed.  Tell your health care provider if you have ever been abused or do not feel safe at home. This information is not intended to replace advice given to you by your health care provider. Make sure you discuss any questions you have with your health care provider. Document Released: 07/31/2010 Document Revised: 06/23/2015 Document Reviewed: 10/19/2014 Elsevier Interactive Patient Education  2017 [employer].

## 2016-06-07 NOTE — Progress Notes (Signed)
Agree with above and recheck BP with PCP visit. Cheryl Benton R., DO

## 2016-06-07 NOTE — Progress Notes (Signed)
Subjective:   Cheryl Franklin is a 76 y.o. female who presents for Medicare Annual (Subsequent) preventive examination.  The Patient was informed that the wellness visit is to identify future health risk and educate and initiate measures that can reduce risk for increased disease through the lifespan.    NO ROS; Medicare Wellness Visit  Describes health as good, fair or great? Great   Preventive Screening -Counseling & Management  Osteoporosis  Colonoscopy 12/2013- aged out now  Mammogram 07/01/2012 - May schedule at [facility] 07/2012 (-2.3) to repeat 98 Drinks a lot of milk; taking calicum 600mg  per day Vit D is in the calcium / is not in the sun Took med for thin bones a long time ago but states it did not help  AAA 03/04/2015 - normal   Smoking history - never smoked Smokeless tobacco no Second Hand Smoke status; No Smokers in the home ETOH - no  Medication adherence or issues? No meds;  Taking asa 81mg  periodically  Does not know which calcium she takes  Does drink milk  RISK FACTORS Diet Breakfast pop-tarts and milk  Lunch; Goes to [employer]; does not cook a lot  Summer tomato sandwich   Regular exercise-  Trying to walk 3 to 4 days a week; walks 2 miles  There is a group of people she walks with  [place] inside.     Cardiac Risk Factors:  Advanced aged  >45 in women Hyperlipidemia - chol 206; HDL 206; Ldl 130; trig 73 Diabetes -neg  Family History - both died of heart problems  Obesity BMI normal   Fall risk no Given education on "Fall Prevention in the Home" for more safety tips the patient can apply as appropriate.  Long term goal is to "age in place  Mobility of Functional changes this year? 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 Anxious, depressed, irritable, sad or blue? no Denies feeling depressed or hopeless; voices pleasure in daily life How many social activities have you been engaged in within the last 2 weeks? no  Hearing Screening Comments: She does not think so  Vision Screening Comments: Vision- had this 2 years ago Dr. Renaldo Fiddler in [place] of Daily Living - See functional screen   Cognitive testing; Ad8 score; 0 or less than 2  MMSE deferred or completed if AD8 + 2 issues Helping sister in law with dementia;  Can't remember anything, calling 911; etc   Advanced Directives in process   Patient Care Team: Dorena Cookey, MD as PCP - General   Immunization History  Administered Date(s) Administered  . Influenza Whole 01/29/2005, 01/29/2006, 10/29/2008  . Influenza, High Dose Seasonal PF 12/08/2013  . Influenza-Unspecified 12/29/2014  . Pneumococcal Conjugate-13 02/01/2014  . Pneumococcal Polysaccharide-23 01/07/2007  . Td 01/29/2005   Required Immunizations needed today  Screening test up to date or reviewed for plan of completion Health Maintenance Due  Topic Date Due  . TETANUS/TDAP  01/30/2015     Cardiac Risk Factors include: advanced age (>22men, >75 women);family history of premature cardiovascular diseasedeclined today but will leave in metric and  will take at the pharmacy or with Dr. Sherren Mocha     Objective:     Vitals: BP 140/70   Pulse 72   Ht 5\' 4"  (1.626 m)   Wt 141 lb 9 oz (64.2 kg)   SpO2 98%   BMI 24.30 kg/m   Body mass index is 24.3 kg/m.   Tobacco History  Smoking Status  . Never Smoker  Smokeless Tobacco  . Never Used     Counseling given: Yes   Past Medical History:  Diagnosis Date  . Allergy   . Baker's cyst   . GERD (gastroesophageal reflux disease)   . OA (osteoarthritis)    no per pt   Past Surgical History:  Procedure Laterality Date   . APPENDECTOMY    . COLONOSCOPY    . TUBAL LIGATION     Family History  Problem Relation Age of Onset  . Diabetes Other   . Stroke Other   . Heart disease Other   . Hypertension Other   . Colon cancer Neg Hx   . Esophageal cancer Neg Hx   . Stomach cancer Neg Hx   . Rectal cancer Neg Hx    History  Sexual Activity  . Sexual activity: Not on file    Outpatient Encounter Prescriptions as of 06/07/2016  Medication Sig  . aspirin 325 MG tablet Take 325 mg by mouth daily.    No facility-administered encounter medications on file as of 06/07/2016.     Activities of Daily Living In your present state of health, do you have any difficulty performing the following activities: 06/07/2016  Hearing? N  Vision? N  Difficulty concentrating or making decisions? N  Walking or climbing stairs? N  Dressing or bathing? N  Doing errands, shopping? N  Preparing Food and eating ? N  Using the Toilet? N  In the past six months, have you accidently leaked urine? N  Do you have problems with loss of bowel control? N  Managing your Medications? N  Managing your Finances? N  Housekeeping or managing your Housekeeping? N  Some recent data might be hidden    Patient Care Team: Dorena Cookey, MD as PCP - General    Assessment:     Exercise Activities and Dietary recommendations Current Exercise Habits: Home exercise routine, Type of exercise: walking, Time (Minutes): 45, Frequency (Times/Week): 4, Weekly Exercise (Minutes/Week): 180, Intensity: Moderate  Goals    . Exercise 150 minutes per week (moderate activity)          Keep walking 2 miles a day      Fall Risk Fall Risk  06/07/2016 03/01/2015 12/08/2013  Falls in the past year? No No No   Depression Screen PHQ 2/9 Scores 06/07/2016 03/01/2015 12/08/2013  PHQ - 2 Score 0 0 0     Cognitive Function MMSE - Mini Mental State Exam 06/07/2016  Not completed: (No Data)   On issues but taking care of sister in law with Alz; type  dementia; given Alz resources       Immunization History  Administered Date(s) Administered  . Influenza Whole 01/29/2005, 01/29/2006, 10/29/2008  . Influenza, High Dose Seasonal PF 12/08/2013  . Influenza-Unspecified 12/29/2014  . Pneumococcal Conjugate-13 02/01/2014  . Pneumococcal Polysaccharide-23 01/07/2007  . Td 01/29/2005   Screening Tests Health Maintenance  Topic Date Due  . TETANUS/TDAP  01/30/2015  . INFLUENZA VACCINE  08/29/2016  . COLONOSCOPY  12/31/2023  . DEXA SCAN  Completed  . PNA vac Low Risk  Adult  Completed      Plan:      PCP Notes  Health Maintenance States she will schedule her mammogram. Discussed scheduling for repeat dexa; given site for osteoporosis to review disease and meds; also educated as to why repeating the dexa scan is beneficial. Enc her to continue to walk 2 miles 3 to 4 days a week; more if she can  Tdap to be updated; Declined injection today; will consider when she sees Dr. Sherren Mocha next week  In process of completing HCPOA and LW;     Abnormal Screens none  Referrals none  Patient concerns; none  Nurse Concerns; BP was 140/70 agreed to check prior to next visit; states it generally does not run this high;    Next PCP apt 06/12/2016   I have personally reviewed and noted the following in the patient's chart:   . Medical and social history . Use of alcohol, tobacco or illicit drugs  . Current medications and supplements . Functional ability and status . Nutritional status . Physical activity . Advanced directives . List of other physicians . Hospitalizations, surgeries, and ER visits in previous 12 months . Vitals . Screenings to include cognitive, depression, and falls . Referrals and appointments  In addition, I have reviewed and discussed with patient certain preventive protocols, quality metrics, and best practice recommendations. A written personalized care plan for preventive services as well as general preventive  health recommendations were provided to patient.     Wynetta Fines, RN  06/07/2016

## 2016-06-12 ENCOUNTER — Other Ambulatory Visit: Payer: Self-pay

## 2016-06-12 ENCOUNTER — Ambulatory Visit (INDEPENDENT_AMBULATORY_CARE_PROVIDER_SITE_OTHER): Payer: Medicare Other | Admitting: Family Medicine

## 2016-06-12 ENCOUNTER — Encounter: Payer: Self-pay | Admitting: Family Medicine

## 2016-06-12 VITALS — BP 126/70 | Temp 98.3°F | Ht 63.0 in | Wt 141.0 lb

## 2016-06-12 DIAGNOSIS — R140 Abdominal distension (gaseous): Secondary | ICD-10-CM | POA: Diagnosis not present

## 2016-06-12 DIAGNOSIS — Z23 Encounter for immunization: Secondary | ICD-10-CM

## 2016-06-12 DIAGNOSIS — I7789 Other specified disorders of arteries and arterioles: Secondary | ICD-10-CM | POA: Diagnosis not present

## 2016-06-12 DIAGNOSIS — K219 Gastro-esophageal reflux disease without esophagitis: Secondary | ICD-10-CM

## 2016-06-12 LAB — CBC WITH DIFFERENTIAL/PLATELET
BASOS PCT: 0.9 % (ref 0.0–3.0)
Basophils Absolute: 0.1 10*3/uL (ref 0.0–0.1)
EOS ABS: 0.4 10*3/uL (ref 0.0–0.7)
EOS PCT: 6.2 % — AB (ref 0.0–5.0)
HEMATOCRIT: 41.5 % (ref 36.0–46.0)
Hemoglobin: 13.6 g/dL (ref 12.0–15.0)
LYMPHS PCT: 33.5 % (ref 12.0–46.0)
Lymphs Abs: 2.1 10*3/uL (ref 0.7–4.0)
MCHC: 32.8 g/dL (ref 30.0–36.0)
MCV: 94.1 fl (ref 78.0–100.0)
MONOS PCT: 5.7 % (ref 3.0–12.0)
Monocytes Absolute: 0.4 10*3/uL (ref 0.1–1.0)
NEUTROS ABS: 3.5 10*3/uL (ref 1.4–7.7)
Neutrophils Relative %: 53.7 % (ref 43.0–77.0)
PLATELETS: 264 10*3/uL (ref 150.0–400.0)
RBC: 4.4 Mil/uL (ref 3.87–5.11)
RDW: 14.1 % (ref 11.5–15.5)
WBC: 6.4 10*3/uL (ref 4.0–10.5)

## 2016-06-12 LAB — POCT URINALYSIS DIPSTICK
BILIRUBIN UA: NEGATIVE
GLUCOSE UA: NEGATIVE
Ketones, UA: NEGATIVE
Nitrite, UA: NEGATIVE
Protein, UA: NEGATIVE
RBC UA: NEGATIVE
Urobilinogen, UA: 0.2 E.U./dL
pH, UA: 6 (ref 5.0–8.0)

## 2016-06-12 LAB — BASIC METABOLIC PANEL
BUN: 12 mg/dL (ref 6–23)
CALCIUM: 9.4 mg/dL (ref 8.4–10.5)
CHLORIDE: 104 meq/L (ref 96–112)
CO2: 27 meq/L (ref 19–32)
CREATININE: 0.74 mg/dL (ref 0.40–1.20)
GFR: 81.29 mL/min (ref >60.00)
GLUCOSE: 82 mg/dL (ref 70–99)
Potassium: 4.1 mEq/L (ref 3.5–5.1)
Sodium: 140 mEq/L (ref 135–145)

## 2016-06-12 LAB — TSH: TSH: 2.46 u[IU]/mL (ref 0.35–4.50)

## 2016-06-12 NOTE — Progress Notes (Signed)
Cheryl Franklin is a 76 year old married female nonsmoker who comes in today for evaluation of abdominal bloating  She had her uterus removed in 2006 because of fibroids. This was done by Dr. Paul Dykes. Subsequently had a urinary tract symptoms abated. Ovaries were left intact. About 3 weeks ago plus she began having symptoms of abdominal bloating. She's had some hyperacidity in the past and has taken some Prilosec which she thinks may have helped.  She also has a knot on her right foot with no history of trauma  She gets routine eye care, dental care, BSE monthly, last mammogram 2014. We discussed the pros and cons of mammography at her age  Colonoscopy 2015 was normal  Vaccinations tetanus booster due today information given on shingles.  14 point review of systems reviewed and otherwise negative  Cognitive function normal she walks daily home health safety reviewed no issues identified, no guns in the house, she does have a healthcare power of attorney and living well  BP 126/70 (BP Location: Left Arm)   Temp 98.3 F (36.8 C) (Oral)   Ht 5\' 3"  (1.6 m)   Wt 141 lb (64 kg)   BMI 24.98 kg/m  Examination HEENT were negative neck was supple thyroid is not enlarged no carotid bruits cardiopulmonary exam normal breast exam normal abdominal exam normal pelvic and rectal deferred extremities normal skin normal peripheral pulses normal. There is a bone spur on the right lateral portion of her right foot  #1 abdominal bloating.......... ultrasound rule out ovarian pathology  #2 bone spur right foot.......... discussed options patient elects to do nothing at this time

## 2016-06-12 NOTE — Patient Instructions (Addendum)
Continue good diet exercise and health habits  We'll get you set up for an ultrasound because of the abdominal bloating.  Tetanus booster today  Shingles vaccine at your local drugstore

## 2016-06-12 NOTE — Telephone Encounter (Signed)
Called to see if pt wanted to schedule awv - left message.  

## 2016-06-27 ENCOUNTER — Ambulatory Visit
Admission: RE | Admit: 2016-06-27 | Discharge: 2016-06-27 | Disposition: A | Discharge status: Home / Self Care | Payer: Medicare Other | Source: Ambulatory Visit | Attending: Family Medicine | Admitting: Family Medicine

## 2016-06-27 ENCOUNTER — Other Ambulatory Visit: Payer: Self-pay | Admitting: Family Medicine

## 2016-06-27 DIAGNOSIS — K8020 Calculus of gallbladder without cholecystitis without obstruction: Secondary | ICD-10-CM | POA: Diagnosis not present

## 2016-06-27 DIAGNOSIS — R140 Abdominal distension (gaseous): Secondary | ICD-10-CM

## 2016-07-18 ENCOUNTER — Encounter: Payer: Self-pay | Admitting: Gastroenterology

## 2016-07-18 ENCOUNTER — Ambulatory Visit (INDEPENDENT_AMBULATORY_CARE_PROVIDER_SITE_OTHER): Payer: Medicare Other | Admitting: Gastroenterology

## 2016-07-18 VITALS — BP 126/70 | HR 76 | Ht 64.0 in | Wt 142.0 lb

## 2016-07-18 DIAGNOSIS — K5730 Diverticulosis of large intestine without perforation or abscess without bleeding: Secondary | ICD-10-CM | POA: Diagnosis not present

## 2016-07-18 DIAGNOSIS — R140 Abdominal distension (gaseous): Secondary | ICD-10-CM | POA: Diagnosis not present

## 2016-07-18 DIAGNOSIS — K8020 Calculus of gallbladder without cholecystitis without obstruction: Secondary | ICD-10-CM

## 2016-07-18 DIAGNOSIS — K5909 Other constipation: Secondary | ICD-10-CM

## 2016-07-18 NOTE — Progress Notes (Signed)
[facility] Consult Note:  History: Cheryl Franklin 07/18/2016  Referring physician: Dorena Cookey, MD  Reason for consult/chief complaint: Cholelithiasis (Abnormal [place], sharp pain right side in back occassionally, bloating and burping)   Subjective  HPI:  This is a 76 year old woman referred by Dr. Sherren Mocha of primary care for abdominal bloating and gallstones. She was last seen by Dr. Olevia Perches for a routine colonoscopy in December 2015, at which time severe diverticulosis with left colon tortuosity was discovered. The colonoscopy was otherwise normal. Cheryl Franklin describes at least several years of intermittent lower abdominal bloating and tendencies to constipation. She  treats her constipation with dietary measures such as increased fiber or vegetables. She often has belching, but denies pyrosis, regurgitation, early satiety, dysphagia, nausea, vomiting or weight loss. She takes omeprazole as needed for her bloating but does not find it helpful. Because of these symptoms, at a primary care visit last month she was sent for an abdominal and pelvic ultrasound to rule out pelvic mass or ascites. Those ultrasounds were normal except for multiple gallstones. Cheryl Franklin notes, almost in passing, that she will occasionally have some fleeting right mid back pain that is not associated with eating, position, time of day or bowel movements.  ROS:  Review of Systems  Constitutional: Negative for appetite change and unexpected weight change.  HENT: Negative for mouth sores and voice change.   Eyes: Negative for pain and redness.  Respiratory: Negative for cough and shortness of breath.   Cardiovascular: Negative for chest pain and palpitations.  Genitourinary: Negative for dysuria and hematuria.  Musculoskeletal: Positive for arthralgias. Negative for myalgias.  Skin: Negative for pallor and rash.  Neurological: Negative for weakness and headaches.  Hematological: Negative for adenopathy.      Past Medical History: Past Medical History:  Diagnosis Date  . Allergy   . Baker's cyst   . GERD (gastroesophageal reflux disease)   . OA (osteoarthritis)    no per pt     Past Surgical History: Past Surgical History:  Procedure Laterality Date  . APPENDECTOMY    . COLONOSCOPY    . TUBAL LIGATION       Family History: Family History  Problem Relation Age of Onset  . Diabetes Other   . Stroke Other   . Heart disease Other   . Hypertension Other   . Heart disease Mother   . Heart disease Father   . Colon cancer Neg Hx   . Esophageal cancer Neg Hx   . Stomach cancer Neg Hx   . Rectal cancer Neg Hx     Social History: Social History   Social History  . Marital status: Widowed    Spouse name: N/A  . Number of children: N/A  . Years of education: N/A   Social History Main Topics  . Smoking status: Never Smoker  . Smokeless tobacco: Never Used  . Alcohol use No  . Drug use: No  . Sexual activity: Not Asked   Other Topics Concern  . None   Social History Narrative   Lives alone in [place] style home with drive in garage downstairs, so she walks up stairs to her living space   Plans to age in place    No falls; no problem with stairs    Has 2 children who live near    3 grandchildren live near;    2 grands live away; one in  [place]; one going to  [employer] this summer   One  going to College in  [place]     Allergies: No Known Allergies  Outpatient Meds: Current Outpatient Prescriptions  Medication Sig Dispense Refill  . aspirin 325 MG tablet Take 325 mg by mouth daily.     Marland Kitchen BIOTIN PO Take by mouth.    . Calcium Carbonate (CALCIUM 600 PO) Take 1 tablet by mouth daily.    Marland Kitchen omeprazole (PRILOSEC OTC) 20 MG tablet Take 20 mg by mouth daily.     No current facility-administered medications for this visit.       ___________________________________________________________________ Objective   Exam:  BP 126/70   Pulse 76   Ht 5'  4" (1.626 m)   Wt 142 lb (64.4 kg)   BMI 24.37 kg/m    General: this is a(n) Well-appearing woman with good muscle mass and normal vocal quality   Eyes: sclera anicteric, no redness  ENT: oral mucosa moist without lesions, no cervical or supraclavicular lymphadenopathy, good dentition  CV: RRR without murmur, S1/S2, no JVD, no peripheral edema  Resp: clear to auscultation bilaterally, normal RR and effort noted  GI: soft, no tenderness, with active bowel sounds. No guarding or palpable organomegaly noted.  Skin; warm and dry, no rash or jaundice noted  Neuro: awake, alert and oriented x 3. Normal gross motor function and fluent speech No paraspinous tenderness Labs:  CMP Latest Ref Rng & Units 06/12/2016 02/22/2015 01/25/2014  Glucose 70 - 99 mg/dL 82 78 80  BUN 6 - 23 mg/dL 12 12 12   Creatinine 0.40 - 1.20 mg/dL 0.74 0.67 0.7  Sodium 135 - 145 mEq/L 140 140 140  Potassium 3.5 - 5.1 mEq/L 4.1 3.9 4.0  Chloride 96 - 112 mEq/L 104 104 107  CO2 19 - 32 mEq/L 27 27 26   Calcium 8.4 - 10.5 mg/dL 9.4 9.3 9.0  Total Protein 6.0 - 8.3 g/dL - - 7.2  Total Bilirubin 0.2 - 1.2 mg/dL - - 0.5  Alkaline Phos 39 - 117 U/L - - 79  AST 0 - 37 U/L - - 21  ALT 0 - 35 U/L - - 17     Radiologic Studies:Pelvic and abdominal ultrasound as noted above. CBD 3 mm  Assessment: Encounter Diagnoses  Name Primary?  . Abdominal bloating Yes  . Other constipation   . Diverticulosis of large intestine without hemorrhage   . Gallstones     Her abdominal bloating appears due to chronic constipation from severe diverticulosis. I gave her some written advice for stool softener and as needed use of MiraLAX if the dietary measures do not seem to control it sufficiently. I think the gallstones are entirely unrelated to the bloating. It does not even seem that she is having symptoms. The fleeting back pain is not clearly related to her gallstones. I would not send her for surgical consultation at this  point. I described for her in detail with biliary colic typically looks like, and asked her to contact us if she begins having those symptoms.  I have also told her I think she can stop the omeprazole since it is not likely to be helpful for bloating and she does not have chronic heartburn.  I will be glad to see her as the need arises.  Thank you for the courtesy of this consult.  Please call me with any questions or concerns.  Nelida Meuse III  CC: Dorena Cookey, MD

## 2016-07-18 NOTE — Patient Instructions (Addendum)
Constipation:  Increase your intake of water, fruits/vegetables and your activity level.  If dietary measures are not sufficient to relieve your constipation,  Ducosate stool softener, 100 mg twice daily. If not improved in 1 week after starting that, please add:  Miralax powder  1 capful in a glass of water or juice once daily. ____________________________________________________________________________  If you are age 75 or older, your body mass index should be between 23-30. Your Body mass index is 24.37 kg/m. If this is out of the aforementioned range listed, please consider follow up with your Primary Care Provider.  If you are age 74 or younger, your body mass index should be between 19-25. Your Body mass index is 24.37 kg/m. If this is out of the aformentioned range listed, please consider follow up with your Primary Care Provider.    Food Guidelines for gas and bloating:   Many people have difficulty digesting certain foods, causing a variety of distressing and embarrassing symptoms such as abdominal pain, bloating and gas.  These foods may need to be avoided or consumed in small amounts.  Here are some tips that might be helpful for you.  1.   Lactose intolerance is the difficulty or complete inability to digest lactose, the natural sugar in milk and anything made from milk.  This condition is harmless, common, and can begin any time during life.  Some people can digest a modest amount of lactose while others cannot tolerate any.  Also, not all dairy products contain equal amounts of lactose.  For example, hard cheeses such as parmesan have less lactose than soft cheeses such as cheddar.  Yogurt has less lactose than milk or cheese.  Many packaged foods (even many brands of bread) have milk, so read ingredient lists carefully.  It is difficult to test for lactose intolerance, so just try avoiding lactose as much as possible for a week and see what happens with your symptoms.  If you  seem to be lactose intolerant, the best plan is to avoid it (but make sure you get calcium from another source).  The next best thing is to use lactase enzyme supplements, available over the counter everywhere.  Just know that many lactose intolerant people need to take several tablets with each serving of dairy to avoid symptoms.  Lastly, a lot of restaurant food is made with milk or butter.  Many are things you might not suspect, such as mashed potatoes, rice and pasta (cooked with butter) and "grilled" items.  If you are lactose intolerant, it never hurts to ask your server what has milk or butter.  2.   Fiber is an important part of your diet, but not all fiber is well-tolerated.  Insoluble fiber such as bran is often consumed by normal gut bacteria and converted into gas.  Soluble fiber such as oats, squash, carrots and green beans are typically tolerated better.  3.   Some types of carbohydrates can be poorly digested.  Examples include: fructose (apples, cherries, pears, raisins and other dried fruits), fructans (onions, zucchini, large amounts of wheat), sorbitol/mannitol/xylitol and sucralose/Splenda (common artificial sweeteners), and raffinose (lentils, broccoli, cabbage, asparagus, brussel sprouts, many types of beans).  Do a Development worker, community for [employer] and you will find helpful information. Beano, a dietary supplement, will often help with raffinose-containing foods.  As with lactase tablets, you may need several per serving.  4.   Whenever possible, avoid processed food&meats and chemical additives.  High fructose corn syrup, a common sweetener, may  be difficult to digest.  Eggs and soy (comes from the soybean, and added to many foods now) are the other most common bloating/gassy foods.  - Dr. [facility]

## 2016-07-19 ENCOUNTER — Ambulatory Visit: Payer: Medicare Other | Admitting: Gastroenterology

## 2016-10-19 ENCOUNTER — Encounter: Payer: Self-pay | Admitting: Family Medicine

## 2016-12-26 DIAGNOSIS — H524 Presbyopia: Secondary | ICD-10-CM | POA: Diagnosis not present

## 2016-12-26 DIAGNOSIS — H2513 Age-related nuclear cataract, bilateral: Secondary | ICD-10-CM | POA: Diagnosis not present

## 2017-02-27 ENCOUNTER — Ambulatory Visit (INDEPENDENT_AMBULATORY_CARE_PROVIDER_SITE_OTHER): Payer: Medicare Other | Admitting: Family Medicine

## 2017-02-27 ENCOUNTER — Encounter: Payer: Self-pay | Admitting: Family Medicine

## 2017-02-27 VITALS — BP 120/84 | HR 76 | Temp 98.2°F | Wt 142.0 lb

## 2017-02-27 DIAGNOSIS — R42 Dizziness and giddiness: Secondary | ICD-10-CM | POA: Insufficient documentation

## 2017-02-27 NOTE — Patient Instructions (Signed)
Per our discussion of vertigo.................GTG................. GO TO GROUND.......Marland Kitchen As soon as you feel the onset of any symptoms. Find a spot on the ceiling and stair and hold still and 9 times out of 10 it will resolve shortly

## 2017-02-27 NOTE — Progress Notes (Signed)
Matayah is a 77 year old female nonsmoker who comes in today for evaluation of vertigo  She had a spell a couple weeks where she felt wobbly. It lasted for a few seconds and then went away. Over this past weekend she was at church the symptoms recurred and it took 20 or 30 minutes for the sensation to abate. She had no nausea vomiting. No headache.  Neurologic review of systems an ENT review of systems negative  BP 120/84 (BP Location: Left Arm, Patient Position: Sitting, Cuff Size: Normal)   Pulse 76   Temp 98.2 F (36.8 C) (Oral)   Wt 142 lb (64.4 kg)   BMI 24.37 kg/m  Well-developed well-nourished female no acute distress vital signs stable she's afebrile HEENT were negative neurologic normal  #1 benign positional vertigo,,,,,,,,,,,, treat symptomatically as outlined

## 2017-05-14 DIAGNOSIS — L300 Nummular dermatitis: Secondary | ICD-10-CM | POA: Diagnosis not present

## 2017-05-16 DIAGNOSIS — R071 Chest pain on breathing: Secondary | ICD-10-CM | POA: Diagnosis not present

## 2017-06-09 NOTE — Progress Notes (Addendum)
Subjective:   Cheryl Franklin is a 77 y.o. female who presents for Medicare Annual (Subsequent) preventive examination.  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   Dexa 08/15/2012 -2.3 States she drinks a lot of milk Mammogram 06/2012- Dr. Sherren Mocha discussed pros and cons No repeats   Colonoscopy 12/30/2013 -aged out   Educated on the shingrix  500 to 600 on the waiting list         Objective:     Vitals: BP 138/78   Pulse 72   Ht 5\' 4"  (1.626 m)   Wt 141 lb 4 oz (64.1 kg)   SpO2 98%   BMI 24.25 kg/m   Body mass index is 24.25 kg/m.  Advanced Directives 06/11/2017 12/15/2013 12/03/2013  Does Patient Have a Medical Advance Directive? No No No  Would patient like information on creating a medical advance directive? - - No - patient declined information    Tobacco Social History   Tobacco Use  Smoking Status Never Smoker  Smokeless Tobacco Never Used     Counseling given: Yes   Clinical Intake:    Past Medical History:  Diagnosis Date  . Allergy   . Baker's cyst   . GERD (gastroesophageal reflux disease)   . OA (osteoarthritis)    no per pt   Past Surgical History:  Procedure Laterality Date  . APPENDECTOMY    . COLONOSCOPY    . TUBAL LIGATION     Family History  Problem Relation Age of Onset  .  Diabetes Other   . Stroke Other   . Heart disease Other   . Hypertension Other   . Heart disease Mother   . Heart disease Father   . Colon cancer Neg Hx   . Esophageal cancer Neg Hx   . Stomach cancer Neg Hx   . Rectal cancer Neg Hx    Social History   Socioeconomic History  . Marital status: Widowed    Spouse name: Not on file  . Number of children: Not on file  . Years of education: Not on file  . Highest education level: Not on file  Occupational History  . Not on file  Social Needs  . Financial resource strain: Not on file  . Food insecurity:    Worry: Not on file    Inability: Not on file  . Transportation needs:    Medical: Not on file    Non-medical: Not on file  Tobacco Use  . Smoking status: Never Smoker  . Smokeless tobacco: Never Used  Substance and Sexual Activity  . Alcohol use: No  . Drug use: No  . Sexual activity: Not on file  Lifestyle  . Physical activity:    Days per week: Not on file    Minutes per session: Not on file  . Stress: Not on file  Relationships  . Social connections:    Talks on phone: Not on file    Gets together: Not on file    Attends religious service: Not on file    Active member of club or organization: Not on file    Attends meetings of clubs or organizations: Not on file    Relationship status: Not on file  Other Topics Concern  . Not on file  Social History Narrative   Lives alone in [place] style home with drive in garage downstairs, so she walks up stairs to her living space   Plans to age in place    No falls; no problem with stairs    Has 2 children who live near    3 grandchildren live near;    2 grands live away; one in  [place]; one going to  [employer] this summer   One going to [place] in  [place]     Outpatient Encounter Medications as of 06/11/2017  Medication Sig  . aspirin 325 MG tablet Take 325 mg by mouth daily.   Marland Kitchen BIOTIN PO Take by mouth.  . Calcium Carbonate (CALCIUM 600 PO) Take 1  tablet by mouth daily.  Marland Kitchen omeprazole (PRILOSEC OTC) 20 MG tablet Take 20 mg by mouth as needed.    No facility-administered encounter medications on file as of 06/11/2017.     Activities of Daily Living No flowsheet data found.  Patient Care Team: Dorena Cookey, MD as PCP - General    Assessment:   This is a routine wellness examination for Cheryl Franklin.  Exercise Activities and Dietary recommendations    Goals    . Exercise 150 min/wk Moderate Activity     Will start walking again 40 minutes This will help your bones!        Fall Risk Fall Risk  06/11/2017 06/07/2016 03/01/2015 12/08/2013  Falls in the past year? Yes No No No  Comment got out of car and went down on concrete  - - -  Number falls in past yr: 1 - - -  Injury with Fall? Yes - - -  Follow up Education provided - - -  Comment knocked her glasses off - - -     Depression Screen PHQ 2/9 Scores 06/11/2017 06/07/2016 03/01/2015 12/08/2013  PHQ - 2 Score 0 0 0 0     Cognitive Function MMSE - Mini Mental State Exam 06/07/2016  Not completed: (No Data)   Ad8 score reviewed for issues:  Issues making decisions:  Less interest in hobbies / activities:  Repeats questions, stories (family complaining):  Trouble using ordinary gadgets (microwave, computer, phone):  Forgets the month or year:   Mismanaging finances:   Remembering appts:  Daily problems with thinking and/or memory: Ad8 score is=0         Immunization History  Administered Date(s) Administered  . Influenza Whole 01/29/2005, 01/29/2006, 10/29/2008  . Influenza, High Dose Seasonal PF 12/08/2013  . Influenza-Unspecified 12/29/2014, 10/29/2016  . Pneumococcal Conjugate-13 02/01/2014  . Pneumococcal Polysaccharide-23 01/07/2007  . Td 01/29/2005, 06/12/2016     Screening Tests Health Maintenance  Topic Date Due  . INFLUENZA VACCINE  08/29/2017  . TETANUS/TDAP  06/13/2026  . DEXA SCAN  Completed  . PNA vac Low Risk Adult  Completed  Plan:      PCP Notes   Health Maintenance Discussed mammogram and repeat dexa last year Declines both;  Did sustain a recent fall with no fx  Verbalizes awareness of the shingrix but is a long waiting list in her area.   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 Medical and social history . Use of alcohol, tobacco or illicit drugs  . Current medications and supplements . Functional ability and status . Nutritional status . Physical activity . Advanced directives . List of other physicians . Hospitalizations, surgeries, and ER visits in previous 12 months . Vitals . Screenings to include cognitive, depression, and falls . Referrals and appointments  In addition, I have reviewed and discussed with patient certain preventive protocols, quality metrics, and best practice recommendations. A written personalized care plan for preventive services as well as general preventive health recommendations were provided to patient.     ASNKN,[email or web address], RN  06/11/2017  I have reviewed the documentation for the [facility] and [place] provided by the health coach and agree with their documentation. I was immediately available for any questions  Eulas Post MD [facility]

## 2017-06-11 ENCOUNTER — Ambulatory Visit (INDEPENDENT_AMBULATORY_CARE_PROVIDER_SITE_OTHER): Payer: Medicare Other

## 2017-06-11 VITALS — BP 138/78 | HR 72 | Ht 64.0 in | Wt 141.2 lb

## 2017-06-11 DIAGNOSIS — Z0000 Encounter for general adult medical examination without abnormal findings: Secondary | ICD-10-CM | POA: Diagnosis not present

## 2017-06-11 NOTE — Patient Instructions (Addendum)
Cheryl Franklin , Thank you for taking time to come for your Medicare Wellness Visit. I appreciate your ongoing commitment to your health goals. Please review the following plan we discussed and let me know if I can assist you in the future.   Shingrix is a vaccine for the prevention of Shingles in Adults 50 and older.  If you are on Medicare, the shingrix is covered under your Part D plan, so you will take both of the vaccines in the series at your pharmacy. Please check with your benefits regarding applicable copays or out of pocket expenses.  The Shingrix is given in 2 vaccines approx 8 weeks apart. You must receive the 2nd dose prior to 6 months from receipt of the first. Please have the pharmacist print out you Immunization  dates for our office records   Deaf & Hard of Hearing Division Services - can assist with hearing aid x 1  No reviews  [facility]  [phone]  [phone]  [email or web address]    These are the goals we discussed: Goals    . Exercise 150 min/wk Moderate Activity     Will start walking again 40 minutes This will help your bones!        This is a list of the screening recommended for you and due dates:  Health Maintenance  Topic Date Due  . Flu Shot  08/29/2017  . Tetanus Vaccine  06/13/2026  . DEXA scan (bone density measurement)  Completed  . Pneumonia vaccines  Completed    Prevention of falls: Remove rugs or any tripping hazards in the home Use Non slip mats in bathtubs and showers Placing grab bars next to the toilet and or shower Placing handrails on both sides of the stair way Adding extra lighting in the home.   Personal safety issues reviewed:  1. Consider starting a community watch program per [facility] 2.  Changes batteries is smoke detector and/or carbon monoxide detector  3.  If you have firearms; keep them in a  safe place 4.  Wear protection when in the sun; Always wear sunscreen or a hat; It is good to have your doctor check your skin annually or review any new areas of concern 5. Driving safety; Keep in the right lane; stay 3 car lengths behind the car in front of you on the highway; look 3 times prior to pulling out; carry your cell phone everywhere you go!      Fall Prevention in the Home Falls can cause injuries. They can happen to people of all ages. There are many things you can do to make your home safe and to help prevent falls. What can I do on the outside of my home?  Regularly fix the edges of walkways and driveways and fix any cracks.  Remove anything that might make you trip as you walk through a door, such as a raised step or threshold.  Trim any bushes or trees on the path to your home.  Use bright outdoor lighting.  Clear any walking paths of anything that might make someone trip, such as rocks or tools.  Regularly check to see if handrails are loose or broken. Make sure that both sides of any steps have handrails.  Any raised decks and porches should have guardrails on the edges.  Have any leaves, snow, or ice cleared regularly.  Use sand or salt on walking paths during winter.  Clean up any spills in your  garage right away. This includes oil or grease spills. What can I do in the bathroom?  Use night lights.  Install grab bars by the toilet and in the tub and shower. Do not use towel bars as grab bars.  Use non-skid mats or decals in the tub or shower.  If you need to sit down in the shower, use a plastic, non-slip stool.  Keep the floor dry. Clean up any water that spills on the floor as soon as it happens.  Remove soap buildup in the tub or shower regularly.  Attach bath mats securely with double-sided non-slip rug tape.  Do not have throw rugs and other things on the floor that can make you trip. What can I do in the bedroom?  Use night lights.  Make  sure that you have a light by your bed that is easy to reach.  Do not use any sheets or blankets that are too big for your bed. They should not hang down onto the floor.  Have a firm chair that has side arms. You can use this for support while you get dressed.  Do not have throw rugs and other things on the floor that can make you trip. What can I do in the kitchen?  Clean up any spills right away.  Avoid walking on wet floors.  Keep items that you use a lot in easy-to-reach places.  If you need to reach something above you, use a strong step stool that has a grab bar.  Keep electrical cords out of the way.  Do not use floor polish or wax that makes floors slippery. If you must use wax, use non-skid floor wax.  Do not have throw rugs and other things on the floor that can make you trip. What can I do with my stairs?  Do not leave any items on the stairs.  Make sure that there are handrails on both sides of the stairs and use them. Fix handrails that are broken or loose. Make sure that handrails are as long as the stairways.  Check any carpeting to make sure that it is firmly attached to the stairs. Fix any carpet that is loose or worn.  Avoid having throw rugs at the top or bottom of the stairs. If you do have throw rugs, attach them to the floor with carpet tape.  Make sure that you have a light switch at the top of the stairs and the bottom of the stairs. If you do not have them, ask someone to add them for you. What else can I do to help prevent falls?  Wear shoes that: ? Do not have high heels. ? Have rubber bottoms. ? Are comfortable and fit you well. ? Are closed at the toe. Do not wear sandals.  If you use a stepladder: ? Make sure that it is fully opened. Do not climb a closed stepladder. ? Make sure that both sides of the stepladder are locked into place. ? Ask someone to hold it for you, if possible.  Clearly mark and make sure that you can see: ? Any grab  bars or handrails. ? First and last steps. ? Where the edge of each step is.  Use tools that help you move around (mobility aids) if they are needed. These include: ? Canes. ? Walkers. ? Scooters. ? Crutches.  Turn on the lights when you go into a dark area. Replace any light bulbs as soon as they burn out.  Set  up your furniture so you have a clear path. Avoid moving your furniture around.  If any of your floors are uneven, fix them.  If there are any pets around you, be aware of where they are.  Review your medicines with your doctor. Some medicines can make you feel dizzy. This can increase your chance of falling. Ask your doctor what other things that you can do to help prevent falls. This information is not intended to replace advice given to you by your health care provider. Make sure you discuss any questions you have with your health care provider. Document Released: 11/11/2008 Document Revised: 06/23/2015 Document Reviewed: 02/19/2014 Elsevier Interactive Patient Education  2018 [place] Maintenance, Female Adopting a healthy lifestyle and getting preventive care can go a long way to promote health and wellness. Talk with your health care provider about what schedule of regular examinations is right for you. This is a good chance for you to check in with your provider about disease prevention and staying healthy. In between checkups, there are plenty of things you can do on your own. Experts have done a lot of research about which lifestyle changes and preventive measures are most likely to keep you healthy. Ask your health care provider for more information. Weight and diet Eat a healthy diet  Be sure to include plenty of vegetables, fruits, low-fat dairy products, and lean protein.  Do not eat a lot of foods high in solid fats, added sugars, or salt.  Get regular exercise. This is one of the most important things you can do for your health. ? Most adults  should exercise for at least 150 minutes each week. The exercise should increase your heart rate and make you sweat (moderate-intensity exercise). ? Most adults should also do strengthening exercises at least twice a week. This is in addition to the moderate-intensity exercise.  Maintain a healthy weight  Body mass index (BMI) is a measurement that can be used to identify possible weight problems. It estimates body fat based on height and weight. Your health care provider can help determine your BMI and help you achieve or maintain a healthy weight.  For females 50 years of age and older: ? A BMI below 18.5 is considered underweight. ? A BMI of 18.5 to 24.9 is normal. ? A BMI of 25 to 29.9 is considered overweight. ? A BMI of 30 and above is considered obese.  Watch levels of cholesterol and blood lipids  You should start having your blood tested for lipids and cholesterol at 76 years of age, then have this test every 5 years.  You may need to have your cholesterol levels checked more often if: ? Your lipid or cholesterol levels are high. ? You are older than 76 years of age. ? You are at high risk for heart disease.  Cancer screening Lung Cancer  Lung cancer screening is recommended for adults 19-35 years old who are at high risk for lung cancer because of a history of smoking.  A yearly low-dose CT scan of the lungs is recommended for people who: ? Currently smoke. ? Have quit within the past 15 years. ? Have at least a 30-pack-year history of smoking. A pack year is smoking an average of one pack of cigarettes a day for 1 year.  Yearly screening should continue until it has been 15 years since you quit.  Yearly screening should stop if you develop a health problem that would prevent you from having  lung cancer treatment.  Breast Cancer  Practice breast self-awareness. This means understanding how your breasts normally appear and feel.  It also means doing regular breast  self-exams. Let your health care provider know about any changes, no matter how small.  If you are in your 20s or 30s, you should have a clinical breast exam (CBE) by a health care provider every 1-3 years as part of a regular health exam.  If you are 37 or older, have a CBE every year. Also consider having a breast X-ray (mammogram) every year.  If you have a family history of breast cancer, talk to your health care provider about genetic screening.  If you are at high risk for breast cancer, talk to your health care provider about having an MRI and a mammogram every year.  Breast cancer gene (BRCA) assessment is recommended for women who have family members with BRCA-related cancers. BRCA-related cancers include: ? Breast. ? Ovarian. ? Tubal. ? Peritoneal cancers.  Results of the assessment will determine the need for genetic counseling and BRCA1 and BRCA2 testing.  Cervical Cancer Your health care provider may recommend that you be screened regularly for cancer of the pelvic organs (ovaries, uterus, and vagina). This screening involves a pelvic examination, including checking for microscopic changes to the surface of your cervix (Pap test). You may be encouraged to have this screening done every 3 years, beginning at age 72.  For women ages 80-65, health care providers may recommend pelvic exams and Pap testing every 3 years, or they may recommend the Pap and pelvic exam, combined with testing for human papilloma virus (HPV), every 5 years. Some types of HPV increase your risk of cervical cancer. Testing for HPV may also be done on women of any age with unclear Pap test results.  Other health care providers may not recommend any screening for nonpregnant women who are considered low risk for pelvic cancer and who do not have symptoms. Ask your health care provider if a screening pelvic exam is right for you.  If you have had past treatment for cervical cancer or a condition that could  lead to cancer, you need Pap tests and screening for cancer for at least 20 years after your treatment. If Pap tests have been discontinued, your risk factors (such as having a new sexual partner) need to be reassessed to determine if screening should resume. Some women have medical problems that increase the chance of getting cervical cancer. In these cases, your health care provider may recommend more frequent screening and Pap tests.  Colorectal Cancer  This type of cancer can be detected and often prevented.  Routine colorectal cancer screening usually begins at 76 years of age and continues through 76 years of age.  Your health care provider may recommend screening at an earlier age if you have risk factors for colon cancer.  Your health care provider may also recommend using home test kits to check for hidden blood in the stool.  A small camera at the end of a tube can be used to examine your colon directly (sigmoidoscopy or colonoscopy). This is done to check for the earliest forms of colorectal cancer.  Routine screening usually begins at age 36.  Direct examination of the colon should be repeated every 5-10 years through 76 years of age. However, you may need to be screened more often if early forms of precancerous polyps or small growths are found.  Skin Cancer  Check your skin from head to  toe regularly.  Tell your health care provider about any new moles or changes in moles, especially if there is a change in a mole's shape or color.  Also tell your health care provider if you have a mole that is larger than the size of a pencil eraser.  Always use sunscreen. Apply sunscreen liberally and repeatedly throughout the day.  Protect yourself by wearing long sleeves, pants, a wide-brimmed hat, and sunglasses whenever you are outside.  Heart disease, diabetes, and high blood pressure  High blood pressure causes heart disease and increases the risk of stroke. High blood pressure  is more likely to develop in: ? People who have blood pressure in the high end of the normal range (130-139/85-89 mm Hg). ? People who are overweight or obese. ? People who are African American.  If you are 2-28 years of age, have your blood pressure checked every 3-5 years. If you are 66 years of age or older, have your blood pressure checked every year. You should have your blood pressure measured twice-once when you are at a hospital or clinic, and once when you are not at a hospital or clinic. Record the average of the two measurements. To check your blood pressure when you are not at a hospital or clinic, you can use: ? An automated blood pressure machine at a pharmacy. ? A home blood pressure monitor.  If you are between 84 years and 4 years old, ask your health care provider if you should take aspirin to prevent strokes.  Have regular diabetes screenings. This involves taking a blood sample to check your fasting blood sugar level. ? If you are at a normal weight and have a low risk for diabetes, have this test once every three years after 76 years of age. ? If you are overweight and have a high risk for diabetes, consider being tested at a younger age or more often. Preventing infection Hepatitis B  If you have a higher risk for hepatitis B, you should be screened for this virus. You are considered at high risk for hepatitis B if: ? You were born in a country where hepatitis B is common. Ask your health care provider which countries are considered high risk. ? Your parents were born in a high-risk country, and you have not been immunized against hepatitis B (hepatitis B vaccine). ? You have HIV or AIDS. ? You use needles to inject street drugs. ? You live with someone who has hepatitis B. ? You have had sex with someone who has hepatitis B. ? You get hemodialysis treatment. ? You take certain medicines for conditions, including cancer, organ transplantation, and autoimmune  conditions.  Hepatitis C  Blood testing is recommended for: ? Everyone born from [birth year] through 1964. ? Anyone with known risk factors for hepatitis C.  Sexually transmitted infections (STIs)  You should be screened for sexually transmitted infections (STIs) including gonorrhea and chlamydia if: ? You are sexually active and are younger than 76 years of age. ? You are older than 76 years of age and your health care provider tells you that you are at risk for this type of infection. ? Your sexual activity has changed since you were last screened and you are at an increased risk for chlamydia or gonorrhea. Ask your health care provider if you are at risk.  If you do not have HIV, but are at risk, it may be recommended that you take a prescription medicine daily to prevent HIV infection.  This is called pre-exposure prophylaxis (PrEP). You are considered at risk if: ? You are sexually active and do not regularly use condoms or know the HIV status of your partner(s). ? You take drugs by injection. ? You are sexually active with a partner who has HIV.  Talk with your health care provider about whether you are at high risk of being infected with HIV. If you choose to begin PrEP, you should first be tested for HIV. You should then be tested every 3 months for as long as you are taking PrEP. Pregnancy  If you are premenopausal and you may become pregnant, ask your health care provider about preconception counseling.  If you may become pregnant, take 400 to 800 micrograms (mcg) of folic acid every day.  If you want to prevent pregnancy, talk to your health care provider about birth control (contraception). Osteoporosis and menopause  Osteoporosis is a disease in which the bones lose minerals and strength with aging. This can result in serious bone fractures. Your risk for osteoporosis can be identified using a bone density scan.  If you are 59 years of age or older, or if you are at risk for  osteoporosis and fractures, ask your health care provider if you should be screened.  Ask your health care provider whether you should take a calcium or vitamin D supplement to lower your risk for osteoporosis.  Menopause may have certain physical symptoms and risks.  Hormone replacement therapy may reduce some of these symptoms and risks. Talk to your health care provider about whether hormone replacement therapy is right for you. Follow these instructions at home:  Schedule regular health, dental, and eye exams.  Stay current with your immunizations.  Do not use any tobacco products including cigarettes, chewing tobacco, or electronic cigarettes.  If you are pregnant, do not drink alcohol.  If you are breastfeeding, limit how much and how often you drink alcohol.  Limit alcohol intake to no more than 1 drink per day for nonpregnant women. One drink equals 12 ounces of beer, 5 ounces of wine, or 1 ounces of hard liquor.  Do not use street drugs.  Do not share needles.  Ask your health care provider for help if you need support or information about quitting drugs.  Tell your health care provider if you often feel depressed.  Tell your health care provider if you have ever been abused or do not feel safe at home. This information is not intended to replace advice given to you by your health care provider. Make sure you discuss any questions you have with your health care provider. Document Released: 07/31/2010 Document Revised: 06/23/2015 Document Reviewed: 10/19/2014 Elsevier Interactive Patient Education  [employer].

## 2017-06-17 ENCOUNTER — Encounter: Payer: Medicare Other | Admitting: Family Medicine

## 2017-06-18 ENCOUNTER — Encounter: Payer: Medicare Other | Admitting: Family Medicine

## 2017-06-27 ENCOUNTER — Encounter: Payer: Self-pay | Admitting: Family Medicine

## 2017-06-27 ENCOUNTER — Ambulatory Visit (INDEPENDENT_AMBULATORY_CARE_PROVIDER_SITE_OTHER): Payer: Medicare Other | Admitting: Family Medicine

## 2017-06-27 VITALS — BP 112/80 | HR 78 | Temp 97.8°F | Ht 64.0 in | Wt 138.0 lb

## 2017-06-27 DIAGNOSIS — M1990 Unspecified osteoarthritis, unspecified site: Secondary | ICD-10-CM

## 2017-06-27 DIAGNOSIS — Z0000 Encounter for general adult medical examination without abnormal findings: Secondary | ICD-10-CM

## 2017-06-27 DIAGNOSIS — K219 Gastro-esophageal reflux disease without esophagitis: Secondary | ICD-10-CM

## 2017-06-27 LAB — CBC WITH DIFFERENTIAL/PLATELET
BASOS ABS: 0 10*3/uL (ref 0.0–0.1)
Basophils Relative: 0.8 % (ref 0.0–3.0)
EOS PCT: 5.6 % — AB (ref 0.0–5.0)
Eosinophils Absolute: 0.3 10*3/uL (ref 0.0–0.7)
HCT: 39.9 % (ref 36.0–46.0)
HEMOGLOBIN: 13.2 g/dL (ref 12.0–15.0)
LYMPHS ABS: 1.9 10*3/uL (ref 0.7–4.0)
Lymphocytes Relative: 36.1 % (ref 12.0–46.0)
MCHC: 33.1 g/dL (ref 30.0–36.0)
MCV: 93.1 fl (ref 78.0–100.0)
MONO ABS: 0.3 10*3/uL (ref 0.1–1.0)
MONOS PCT: 5.4 % (ref 3.0–12.0)
NEUTROS PCT: 52.1 % (ref 43.0–77.0)
Neutro Abs: 2.7 10*3/uL (ref 1.4–7.7)
Platelets: 249 10*3/uL (ref 150.0–400.0)
RBC: 4.29 Mil/uL (ref 3.87–5.11)
RDW: 14.4 % (ref 11.5–15.5)
WBC: 5.2 10*3/uL (ref 4.0–10.5)

## 2017-06-27 LAB — POCT URINALYSIS DIPSTICK
Bilirubin, UA: NEGATIVE
Glucose, UA: NEGATIVE
Ketones, UA: NEGATIVE
NITRITE UA: NEGATIVE
PH UA: 6 (ref 5.0–8.0)
PROTEIN UA: NEGATIVE
RBC UA: NEGATIVE
Urobilinogen, UA: 0.2 E.U./dL

## 2017-06-27 LAB — BASIC METABOLIC PANEL
BUN: 14 mg/dL (ref 6–23)
CALCIUM: 9.4 mg/dL (ref 8.4–10.5)
CO2: 28 mEq/L (ref 19–32)
CREATININE: 0.68 mg/dL (ref 0.40–1.20)
Chloride: 105 mEq/L (ref 96–112)
GFR: 89.37 mL/min (ref >60.00)
Glucose, Bld: 89 mg/dL (ref 70–99)
Potassium: 4.4 mEq/L (ref 3.5–5.1)
SODIUM: 140 meq/L (ref 135–145)

## 2017-06-27 LAB — TSH: TSH: 2.68 u[IU]/mL (ref 0.35–4.50)

## 2017-06-27 MED ORDER — OMEPRAZOLE MAGNESIUM 20 MG PO TBEC: 20.0000 mg | DELAYED_RELEASE_TABLET | ORAL | 4 refills | Status: DC | PRN

## 2017-06-27 NOTE — Progress Notes (Signed)
Cheryl Franklin is a 77 year old single female nonsmoker who comes in today for general physical examination because of a history of reflux esophagitis and osteoarthritis  Reflexes she takes Prilosec 20 mg daily. Swallowing normally no difficulties.  She takes aspirin one tablet daily. This helps her arthritis somewhat. We talked about various options she would not like to take anything else at this point.  She gets routine eye care, dental care, BSE monthly, last mammogram 2014 at age 38 don't think she needs any mammograms.  Colonoscopy 2015 normal H doubt of colonoscopy also  Vaccinations up-to-date information given on the new shingles vaccine  14 point review of systems reviewed and otherwise negative  Cognitive function normal she walks on a daily basis home health safety reviewed no issues identified, no guns in the house, she does have a healthcare power of attorney and living well.  She's never had a history of any GYN difficulty. Sees 26 years postmenopausal asymptomatic. Declined pelvic exam  BP 112/80 (BP Location: Left Arm, Patient Position: Sitting, Cuff Size: Normal)   Pulse 78   Temp 97.8 F (36.6 C) (Oral)   Ht 5\' 4"  (1.626 m)   Wt 138 lb (62.6 kg)   BMI 23.69 kg/m  Well-developed well-nourished female no acute distress vital signs stable she's afebrile HEENT were negative neck was supple thyroid is not enlarged no carotid bruits. Cardiopulmonary exam normal breast exam normal. Abdominal exam normal. Extremities normal skin normal peripheral pulses normal  #1 healthy female  #2 reflux esophagitis.......... continue Prilosec  #3 osteoarthritis......... continue one aspirin daily since his symptoms are minimal. Talked about options she declined

## 2017-06-27 NOTE — Patient Instructions (Signed)
Labs today......... I will call you if there is anything abnormal.  Continue current medications  Signed up for my chart  Call your insurance company to find out where he gets a new shingles vaccine.  Follow-up in one year for general physical exam sooner if any problems.

## 2018-04-07 DIAGNOSIS — H2513 Age-related nuclear cataract, bilateral: Secondary | ICD-10-CM | POA: Diagnosis not present

## 2018-04-07 DIAGNOSIS — H5213 Myopia, bilateral: Secondary | ICD-10-CM | POA: Diagnosis not present

## 2018-04-08 DIAGNOSIS — H2512 Age-related nuclear cataract, left eye: Secondary | ICD-10-CM | POA: Diagnosis not present

## 2018-04-08 DIAGNOSIS — H18413 Arcus senilis, bilateral: Secondary | ICD-10-CM | POA: Diagnosis not present

## 2018-04-08 DIAGNOSIS — H25043 Posterior subcapsular polar age-related cataract, bilateral: Secondary | ICD-10-CM | POA: Diagnosis not present

## 2018-04-08 DIAGNOSIS — H02834 Dermatochalasis of left upper eyelid: Secondary | ICD-10-CM | POA: Diagnosis not present

## 2018-04-08 DIAGNOSIS — H25013 Cortical age-related cataract, bilateral: Secondary | ICD-10-CM | POA: Diagnosis not present

## 2018-04-08 DIAGNOSIS — H2513 Age-related nuclear cataract, bilateral: Secondary | ICD-10-CM | POA: Diagnosis not present

## 2018-06-03 ENCOUNTER — Telehealth: Payer: Self-pay | Admitting: *Deleted

## 2018-06-03 NOTE — Telephone Encounter (Signed)
That is fine. We can do TOC whenever she wants

## 2018-06-03 NOTE — Telephone Encounter (Signed)
Pt would like to do a TOC from Dr. Sherren Mocha to [facility] told pt would send a message to [facility] to see if he was taking new patients and at that time we would schedule a TOC appointment and AWV when a new provider had been selected. Pt voiced understanding. Cory please advise.

## 2018-06-03 NOTE — Telephone Encounter (Signed)
TOC has been made

## 2018-06-05 ENCOUNTER — Encounter: Payer: Self-pay | Admitting: Adult Health

## 2018-06-05 ENCOUNTER — Other Ambulatory Visit: Payer: Self-pay

## 2018-06-05 ENCOUNTER — Ambulatory Visit (INDEPENDENT_AMBULATORY_CARE_PROVIDER_SITE_OTHER): Payer: Medicare Other | Admitting: Adult Health

## 2018-06-05 DIAGNOSIS — Z7689 Persons encountering health services in other specified circumstances: Secondary | ICD-10-CM

## 2018-06-05 DIAGNOSIS — M170 Bilateral primary osteoarthritis of knee: Secondary | ICD-10-CM

## 2018-06-05 DIAGNOSIS — K219 Gastro-esophageal reflux disease without esophagitis: Secondary | ICD-10-CM | POA: Diagnosis not present

## 2018-06-05 NOTE — Progress Notes (Signed)
Virtual Visit via Video Note  I connected withBetty Franklin on 06/05/18 at  2:00 PM EDT by a video enabled telemedicine application and verified that I am speaking with the correct person using two identifiers.  Location patient: home Location provider:work or home office Persons participating in the virtual visit: patient, provider  I discussed the limitations of evaluation and management by telemedicine and the availability of in person appointments. The patient expressed understanding and agreed to proceed.  Establish Care Visit. Former patient of Dr. Sherren Mocha who last had a CPE in 05/2017   Acute Concerns: Establish Care  Chronic Issues: GERD- Takes Prilosec 20 mg PRN. Has no issues with swallowing   Osteoarthritis - Takes daily ASA which she reports helps with her symptoms. She has denied other options in the past. Reports pain in knee but that it " isn't bad".   Health Maintenance: Dental -- Routine  Vision -- Routine  Immunizations -- UTD Colonoscopy -- Last was in 2015. She no longer needs to have done due to age 78 -- No longer needs PAP -- No longer needs Diet: Tries to eat healthy  Exercise: Walks on a daily basis. Enjoys going to the gym    Past Medical History:  Diagnosis Date  . Allergy   . Baker's cyst   . GERD (gastroesophageal reflux disease)   . OA (osteoarthritis)    no per pt    Past Surgical History:  Procedure Laterality Date  . APPENDECTOMY    . COLONOSCOPY    . TUBAL LIGATION      Current Outpatient Medications on File Prior to Visit  Medication Sig Dispense Refill  . aspirin 325 MG tablet Take 325 mg by mouth daily.     Marland Kitchen BIOTIN PO Take by mouth.    . Calcium Carbonate (CALCIUM 600 PO) Take 1 tablet by mouth daily.    Marland Kitchen omeprazole (PRILOSEC OTC) 20 MG tablet Take 1 tablet (20 mg total) by mouth as needed. 100 tablet 4   No current facility-administered medications on file prior to visit.     No Known Allergies  Family History   Problem Relation Age of Onset  . Diabetes Other   . Stroke Other   . Heart disease Other   . Hypertension Other   . Heart disease Mother   . Heart disease Father   . Diabetes Father   . Colon cancer Neg Hx   . Esophageal cancer Neg Hx   . Stomach cancer Neg Hx   . Rectal cancer Neg Hx     Social History   Socioeconomic History  . Marital status: Widowed    Spouse name: Not on file  . Number of children: Not on file  . Years of education: Not on file  . Highest education level: Not on file  Occupational History  . Not on file  Social Needs  . Financial resource strain: Not on file  . Food insecurity:    Worry: Not on file    Inability: Not on file  . Transportation needs:    Medical: Not on file    Non-medical: Not on file  Tobacco Use  . Smoking status: Never Smoker  . Smokeless tobacco: Never Used  Substance and Sexual Activity  . Alcohol use: No  . Drug use: No  . Sexual activity: Not on file  Lifestyle  . Physical activity:    Days per week: Not on file    Minutes per session: Not on file  .  Stress: Not on file  Relationships  . Social connections:    Talks on phone: Not on file    Gets together: Not on file    Attends religious service: Not on file    Active member of club or organization: Not on file    Attends meetings of clubs or organizations: Not on file    Relationship status: Not on file  . Intimate partner violence:    Fear of current or ex partner: Not on file    Emotionally abused: Not on file    Physically abused: Not on file    Forced sexual activity: Not on file  Other Topics Concern  . Not on file  Social History Narrative   Lives alone in [place] style home with drive in garage downstairs, so she walks up stairs to her living space   Plans to age in place    No falls; no problem with stairs    Has 2 children who live near    3 grandchildren live near;    2 grands live away; one in  [place]; one going to  [employer] this  summer   One going to [employer] in  [place]  Constitutional: Negative.   HENT: Negative.   Eyes: Negative.   Cardiovascular: Negative.   Gastrointestinal: Negative.   Genitourinary: Negative.   Musculoskeletal: Positive for joint pain.  Neurological: Negative.   Psychiatric/Behavioral: Negative.        There were no vitals taken for this visit.  Physical Exam Vitals signs and nursing note reviewed.  Constitutional:      General: She is not in acute distress.    Appearance: Normal appearance. She is well-developed.  HENT:     Head: Normocephalic and atraumatic.     Right Ear: Tympanic membrane, ear canal and external ear normal. There is no impacted cerumen.     Left Ear: Tympanic membrane, ear canal and external ear normal. There is no impacted cerumen.     Nose: Nose normal. No congestion or rhinorrhea.     Mouth/Throat:     Mouth: Mucous membranes are moist.     Pharynx: Oropharynx is clear. No oropharyngeal exudate or posterior oropharyngeal erythema.  Eyes:     General:        Right eye: No discharge.        Left eye: No discharge.     Extraocular Movements: Extraocular movements intact.     Conjunctiva/sclera: Conjunctivae normal.     Pupils: Pupils are equal, round, and reactive to light.  Neck:     Musculoskeletal: Normal range of motion and neck supple.     Thyroid: No thyromegaly.     Trachea: No tracheal deviation.  Cardiovascular:     Rate and Rhythm: Normal rate and regular rhythm.     Heart sounds: Normal heart sounds. No murmur. No friction rub. No gallop.   Pulmonary:     Effort: Pulmonary effort is normal. No respiratory distress.     Breath sounds: Normal breath sounds. No stridor. No wheezing, rhonchi or rales.  Chest:     Chest wall: No tenderness.  Abdominal:     General: Bowel sounds are normal. There is no distension.     Palpations: Abdomen is soft. There is no mass.     Tenderness: There is no abdominal tenderness.  There is no right CVA tenderness, left CVA tenderness, guarding or rebound.     Hernia: No  hernia is present.  Musculoskeletal: Normal range of motion.  Lymphadenopathy:     Cervical: No cervical adenopathy.  Skin:    General: Skin is warm and dry.     Coloration: Skin is not pale.     Findings: No erythema or rash.  Neurological:     General: No focal deficit present.     Mental Status: She is alert and oriented to person, place, and time.     Cranial Nerves: No cranial nerve deficit.     Coordination: Coordination normal.  Psychiatric:        Mood and Affect: Mood normal.        Behavior: Behavior normal.        Thought Content: Thought content normal.        Judgment: Judgment normal.    VITALS per patient if applicable:  GENERAL: alert, oriented, appears well and in no acute distress  HEENT: atraumatic, conjunttiva clear, no obvious abnormalities on inspection of external nose and ears  NECK: normal movements of the head and neck  LUNGS: on inspection no signs of respiratory distress, breathing rate appears normal, no obvious gross SOB, gasping or wheezing  CV: no obvious cyanosis  MS: moves all visible extremities without noticeable abnormality  PSYCH/NEURO: pleasant and cooperative, no obvious depression or anxiety, speech and thought processing grossly intact  No results found for this or any previous visit (from the past 2160 hour(s)).  Assessment/Plan:  Discussed the following assessment and plan:  Will have her follow up for CPE or sooner if needed. Continue with diet and exercise. Take Prilosec PRN for GERD symptoms   Gastroesophageal reflux disease without esophagitis  Encounter to establish care  Primary osteoarthritis of both knees     I discussed the assessment and treatment plan with the patient. The patient was provided an opportunity to ask questions and all were answered. The patient agreed with the plan and demonstrated an understanding of the  instructions.   The patient was advised to call back or seek an in-person evaluation if the symptoms worsen or if the condition fails to improve as anticipated.   [name], NP

## 2018-06-16 DIAGNOSIS — H2512 Age-related nuclear cataract, left eye: Secondary | ICD-10-CM | POA: Diagnosis not present

## 2018-06-17 ENCOUNTER — Ambulatory Visit: Payer: Medicare Other

## 2018-06-17 DIAGNOSIS — H2511 Age-related nuclear cataract, right eye: Secondary | ICD-10-CM | POA: Diagnosis not present

## 2018-07-03 ENCOUNTER — Encounter: Payer: Self-pay | Admitting: Adult Health

## 2018-07-03 ENCOUNTER — Other Ambulatory Visit: Payer: Self-pay

## 2018-07-03 ENCOUNTER — Ambulatory Visit (INDEPENDENT_AMBULATORY_CARE_PROVIDER_SITE_OTHER): Payer: Medicare Other | Admitting: Adult Health

## 2018-07-03 VITALS — BP 138/72 | Temp 98.0°F | Ht 64.0 in | Wt 142.0 lb

## 2018-07-03 DIAGNOSIS — M810 Age-related osteoporosis without current pathological fracture: Secondary | ICD-10-CM | POA: Diagnosis not present

## 2018-07-03 DIAGNOSIS — E7849 Other hyperlipidemia: Secondary | ICD-10-CM

## 2018-07-03 DIAGNOSIS — K219 Gastro-esophageal reflux disease without esophagitis: Secondary | ICD-10-CM

## 2018-07-03 LAB — COMPREHENSIVE METABOLIC PANEL
ALT: 13 U/L (ref 0–35)
AST: 18 U/L (ref 0–37)
Albumin: 4.2 g/dL (ref 3.5–5.2)
Alkaline Phosphatase: 80 U/L (ref 39–117)
BUN: 12 mg/dL (ref 6–23)
CO2: 29 mEq/L (ref 19–32)
Calcium: 9.3 mg/dL (ref 8.4–10.5)
Chloride: 102 mEq/L (ref 96–112)
Creatinine, Ser: 0.72 mg/dL (ref 0.40–1.20)
GFR: 78.51 mL/min (ref >60.00)
Glucose, Bld: 72 mg/dL (ref 70–99)
Potassium: 3.8 mEq/L (ref 3.5–5.1)
Sodium: 140 mEq/L (ref 135–145)
Total Bilirubin: 0.5 mg/dL (ref 0.2–1.2)
Total Protein: 7.3 g/dL (ref 6.0–8.3)

## 2018-07-03 LAB — LIPID PANEL
Cholesterol: 228 mg/dL — ABNORMAL HIGH (ref 0–200)
HDL: 69.3 mg/dL (ref >39.00)
LDL Cholesterol: 143 mg/dL — ABNORMAL HIGH (ref 0–99)
NonHDL: 159.07
Total CHOL/HDL Ratio: 3
Triglycerides: 80 mg/dL (ref 0.0–149.0)
VLDL: 16 mg/dL (ref 0.0–40.0)

## 2018-07-03 LAB — CBC WITH DIFFERENTIAL/PLATELET
Basophils Absolute: 0.1 10*3/uL (ref 0.0–0.1)
Basophils Relative: 0.9 % (ref 0.0–3.0)
Eosinophils Absolute: 0.2 10*3/uL (ref 0.0–0.7)
Eosinophils Relative: 3.4 % (ref 0.0–5.0)
HCT: 41.2 % (ref 36.0–46.0)
Hemoglobin: 13.7 g/dL (ref 12.0–15.0)
Lymphocytes Relative: 40.3 % (ref 12.0–46.0)
Lymphs Abs: 2.5 10*3/uL (ref 0.7–4.0)
MCHC: 33.3 g/dL (ref 30.0–36.0)
MCV: 94 fl (ref 78.0–100.0)
Monocytes Absolute: 0.4 10*3/uL (ref 0.1–1.0)
Monocytes Relative: 5.9 % (ref 3.0–12.0)
Neutro Abs: 3.1 10*3/uL (ref 1.4–7.7)
Neutrophils Relative %: 49.5 % (ref 43.0–77.0)
Platelets: 240 10*3/uL (ref 150.0–400.0)
RBC: 4.38 Mil/uL (ref 3.87–5.11)
RDW: 14 % (ref 11.5–15.5)
WBC: 6.2 10*3/uL (ref 4.0–10.5)

## 2018-07-03 LAB — TSH: TSH: 3.59 u[IU]/mL (ref 0.35–4.50)

## 2018-07-03 LAB — VITAMIN D 25 HYDROXY (VIT D DEFICIENCY, FRACTURES): VITD: 36.52 ng/mL (ref 30.00–100.00)

## 2018-07-03 NOTE — Progress Notes (Signed)
Subjective:    Patient ID: Cheryl Franklin, female    DOB: 09/23/1940, 78 y.o.   MRN: [id number]  HPI  Patient presents for yearly preventative medicine examination. Pleasant 78 year old female who  has a past medical history of Allergy, Baker's cyst, GERD (gastroesophageal reflux disease), and OA (osteoarthritis).  GERD- She takes Prilosec as needed  Osteoarthritis - chronic knee pain. Refuses   All immunizations and health maintenance protocols were reviewed with the patient and needed orders were placed. UTD  Appropriate screening laboratory values were ordered for the patient including screening of hyperlipidemia, renal function and hepatic function. If indicated by BPH, a PSA was ordered.  Medication reconciliation,  past medical history, social history, problem list and allergies were reviewed in detail with the patient  Goals were established with regard to weight loss, exercise, and  diet in compliance with medications. She enjoys walking and going to the gym.   End of life planning was discussed.  She has no acute issues    Review of Systems  Constitutional: Negative.   HENT: Negative.   Eyes: Negative.   Respiratory: Negative.   Cardiovascular: Negative.   Gastrointestinal: Negative.   Endocrine: Negative.   Genitourinary: Negative.   Musculoskeletal: Positive for arthralgias.  Skin: Negative.   Allergic/Immunologic: Negative.   Neurological: Negative.   Hematological: Negative.   Psychiatric/Behavioral: Negative.       Past Medical History:  Diagnosis Date  . Allergy   . Baker's cyst   . GERD (gastroesophageal reflux disease)   . OA (osteoarthritis)    no per pt    Social History   Socioeconomic History  . Marital status: Widowed    Spouse name: Not on file  . Number of children: Not on file  . Years of education: Not on file  . Highest education level: Not on file  Occupational History  . Not on file  Social Needs  . Financial resource strain:  Not on file  . Food insecurity:    Worry: Not on file    Inability: Not on file  . Transportation needs:    Medical: Not on file    Non-medical: Not on file  Tobacco Use  . Smoking status: Never Smoker  . Smokeless tobacco: Never Used  Substance and Sexual Activity  . Alcohol use: No  . Drug use: No  . Sexual activity: Not on file  Lifestyle  . Physical activity:    Days per week: Not on file    Minutes per session: Not on file  . Stress: Not on file  Relationships  . Social connections:    Talks on phone: Not on file    Gets together: Not on file    Attends religious service: Not on file    Active member of club or organization: Not on file    Attends meetings of clubs or organizations: Not on file    Relationship status: Not on file  . Intimate partner violence:    Fear of current or ex partner: Not on file    Emotionally abused: Not on file    Physically abused: Not on file    Forced sexual activity: Not on file  Other Topics Concern  . Not on file  Social History Narrative   Lives alone in [place] style home with drive in garage downstairs, so she walks up stairs to her living space   Plans to age in place    No falls; no problem with stairs  Has 2 children who live near    3 grandchildren live near;    2 grands live away; one in  [place]; one going to  [employer] this summer   One going to [employer] in  [place]     Past Surgical History:  Procedure Laterality Date  . APPENDECTOMY    . COLONOSCOPY    . TUBAL LIGATION      Family History  Problem Relation Age of Onset  . Diabetes Other   . Stroke Other   . Heart disease Other   . Hypertension Other   . Heart disease Mother   . Heart disease Father   . Diabetes Father   . Colon cancer Neg Hx   . Esophageal cancer Neg Hx   . Stomach cancer Neg Hx   . Rectal cancer Neg Hx     No Known Allergies  Current Outpatient Medications on File Prior to Visit  Medication Sig Dispense Refill  .  aspirin 325 MG tablet Take 325 mg by mouth daily.     Marland Kitchen BIOTIN PO Take by mouth.    . Calcium Carbonate (CALCIUM 600 PO) Take 1 tablet by mouth daily.    Marland Kitchen omeprazole (PRILOSEC OTC) 20 MG tablet Take 1 tablet (20 mg total) by mouth as needed. 100 tablet 4   No current facility-administered medications on file prior to visit.     BP 138/72   Temp 98 F (36.7 C)   Ht 5\' 4"  (1.626 m) Comment: WITH SHOES  Wt 142 lb (64.4 kg)   BMI 24.37 kg/m       Objective:   Physical Exam Vitals signs and nursing note reviewed.  Constitutional:      General: She is not in acute distress.    Appearance: She is well-developed.  HENT:     Head: Normocephalic and atraumatic.     Right Ear: Tympanic membrane, ear canal and external ear normal. There is no impacted cerumen.     Left Ear: Tympanic membrane, ear canal and external ear normal. There is no impacted cerumen.     Nose: Nose normal.     Mouth/Throat:     Mouth: Mucous membranes are moist.     Pharynx: Oropharynx is clear. No oropharyngeal exudate or posterior oropharyngeal erythema.  Eyes:     General:        Right eye: No discharge.        Left eye: No discharge.     Extraocular Movements: Extraocular movements intact.     Conjunctiva/sclera: Conjunctivae normal.     Pupils: Pupils are equal, round, and reactive to light.  Neck:     Musculoskeletal: Normal range of motion and neck supple.     Thyroid: No thyromegaly.     Vascular: No carotid bruit.     Trachea: No tracheal deviation.  Cardiovascular:     Rate and Rhythm: Normal rate and regular rhythm.     Pulses: Normal pulses.     Heart sounds: Normal heart sounds. No murmur. No friction rub. No gallop.   Pulmonary:     Effort: Pulmonary effort is normal. No respiratory distress.     Breath sounds: Normal breath sounds. No stridor. No wheezing, rhonchi or rales.  Chest:     Chest wall: No tenderness.     Breasts:        Right: Normal.        Left: Normal.  Abdominal:      General: Bowel sounds  are normal. There is no distension.     Palpations: Abdomen is soft. There is no mass.     Tenderness: There is no abdominal tenderness. There is no guarding or rebound.  Musculoskeletal: Normal range of motion.        General: No swelling, tenderness, deformity or signs of injury.     Right lower leg: No edema.     Left lower leg: No edema.  Lymphadenopathy:     Cervical: No cervical adenopathy.  Skin:    General: Skin is warm and dry.     Capillary Refill: Capillary refill takes less than 2 seconds.     Coloration: Skin is not jaundiced or pale.     Findings: No bruising, erythema, lesion or rash.  Neurological:     General: No focal deficit present.     Mental Status: She is alert and oriented to person, place, and time. Mental status is at baseline.     Cranial Nerves: No cranial nerve deficit.     Coordination: Coordination normal.  Psychiatric:        Mood and Affect: Mood normal.        Behavior: Behavior normal.        Thought Content: Thought content normal.        Judgment: Judgment normal.       Assessment & Plan:  1. Gastroesophageal reflux disease without esophagitis - Continue with prilosec PRN  - CBC with Differential/Platelet - Comprehensive metabolic panel - Lipid panel - TSH  2. Other hyperlipidemia - Consider statin  - CBC with Differential/Platelet - Comprehensive metabolic panel - Lipid panel - TSH  3. Age-related osteoporosis without current pathological fracture  - Vitamin D, 25-hydroxy  [name], NP

## 2018-07-03 NOTE — Patient Instructions (Signed)
It was great seeing you today   Continue to eat healthy and exercise   We will follow up with you once your blood work is back.   Please follow up in one year or sooner if needed

## 2018-07-07 DIAGNOSIS — H2511 Age-related nuclear cataract, right eye: Secondary | ICD-10-CM | POA: Diagnosis not present

## 2018-08-06 ENCOUNTER — Ambulatory Visit: Payer: Medicare Other

## 2018-08-28 ENCOUNTER — Other Ambulatory Visit: Payer: Self-pay

## 2018-09-11 IMAGING — US US ABDOMEN COMPLETE
1 series · 14 of 25 positions shown · non-contrast
Comparison: Abdominopelvic CT scan December 03, 2013

CLINICAL DATA: Four week history of abdominal bloating.

EXAM:
ABDOMEN ULTRASOUND COMPLETE

[Series 1: us abdomen complete · 0.17mm/px · 14 of 98 slices shown]
[im 1/98]
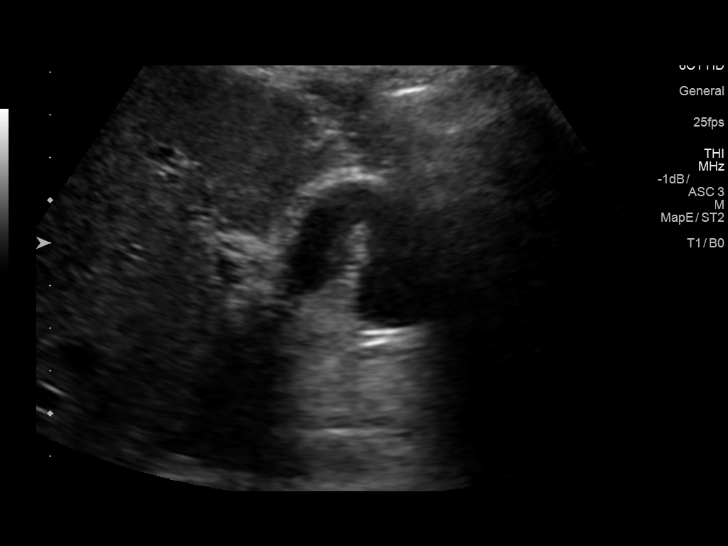
[im 9/98]
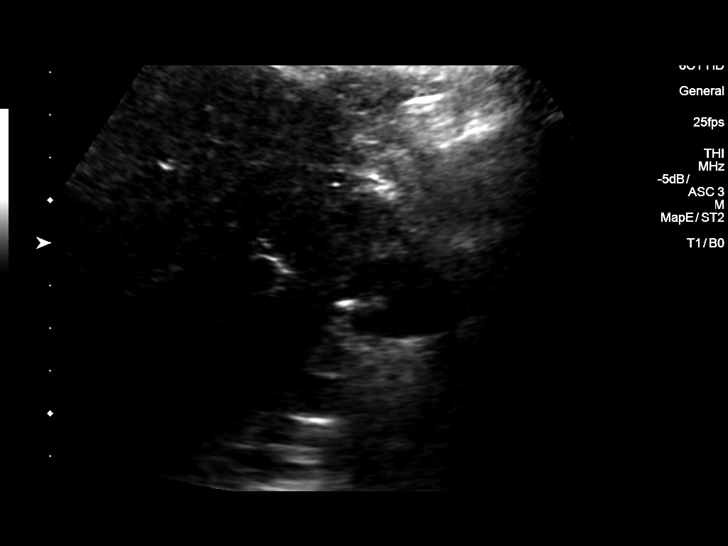
[im 17/98]
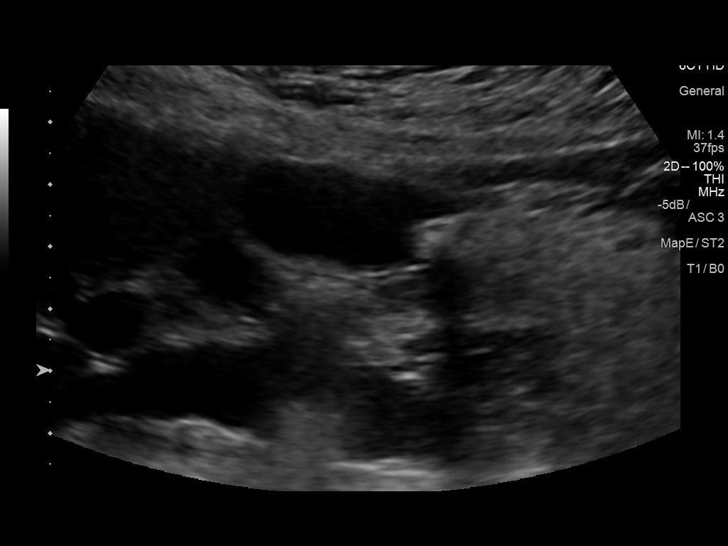
[im 25/98]
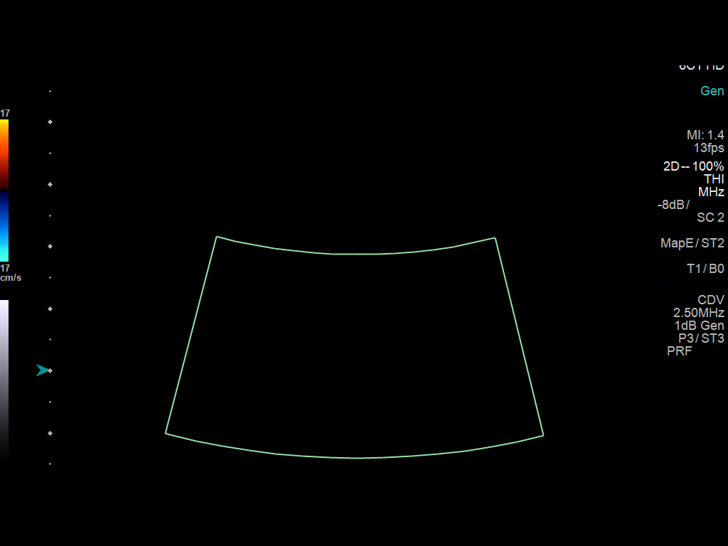
[im 33/98]
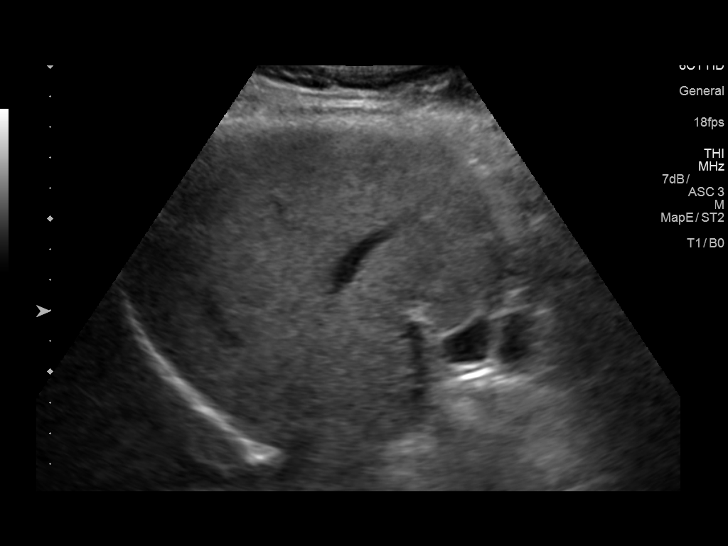
[im 37/98]
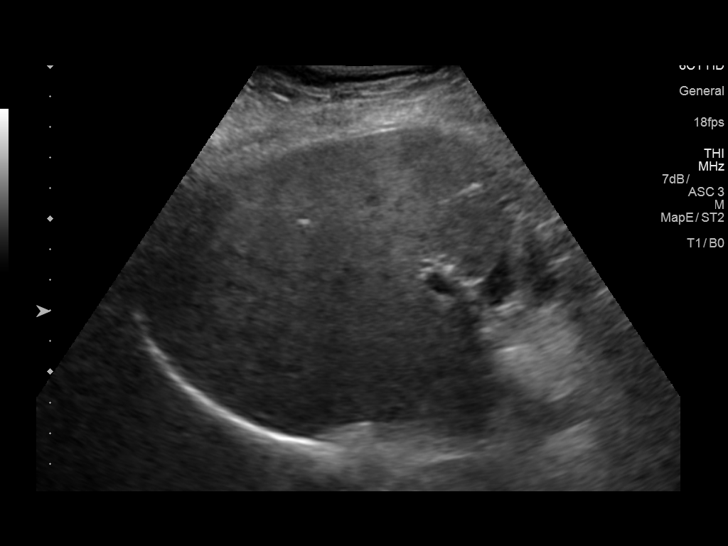
[im 45/98]
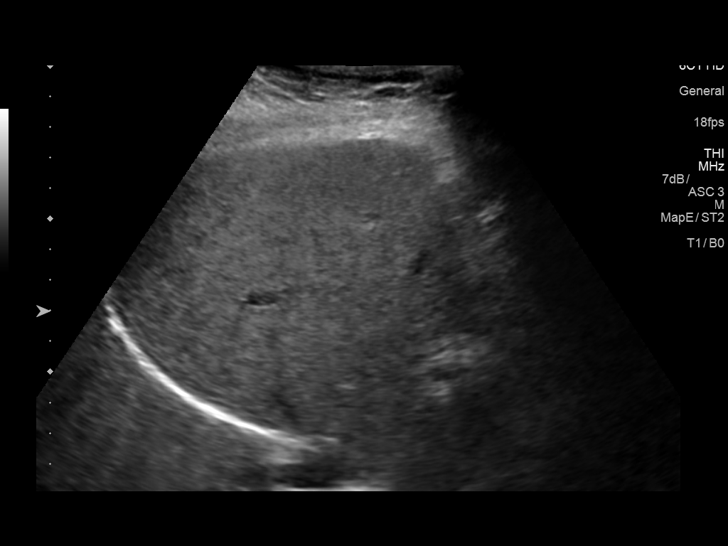
[im 53/98]
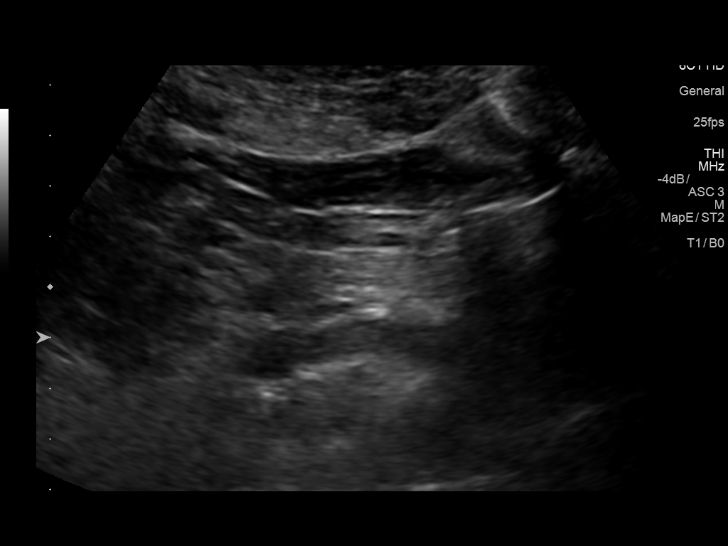
[im 61/98]
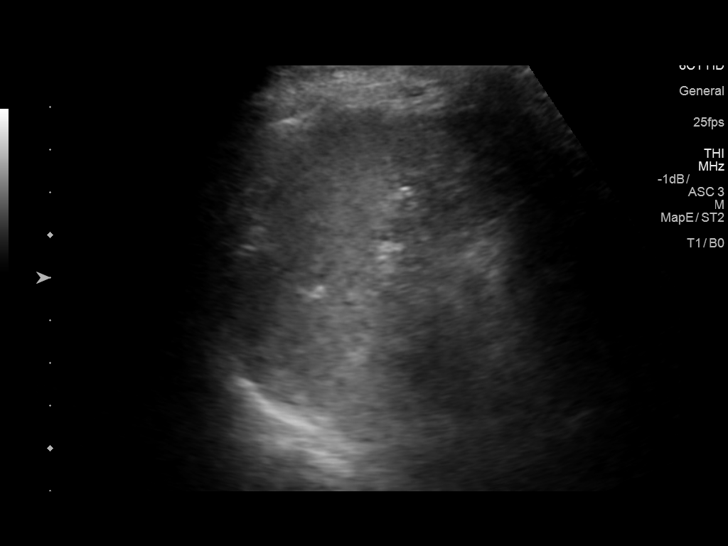
[im 65/98]
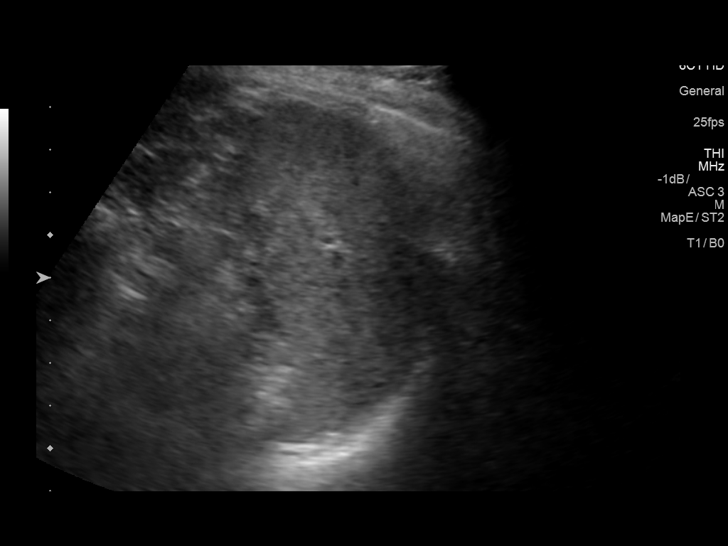
[im 73/98]
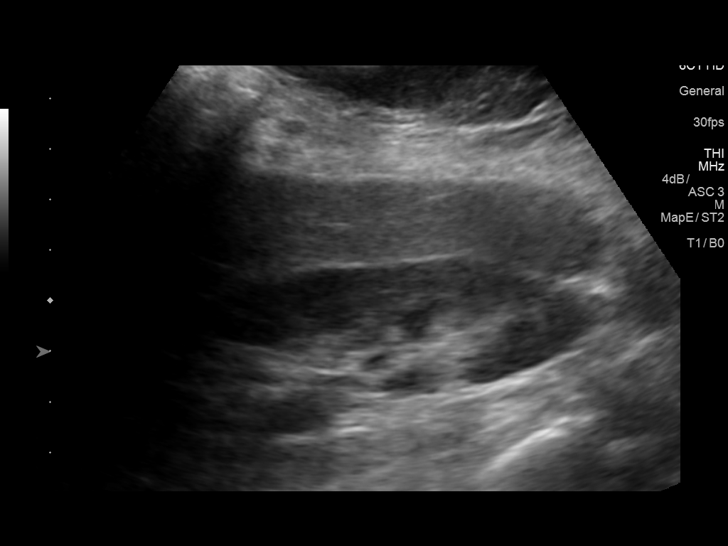
[im 81/98]
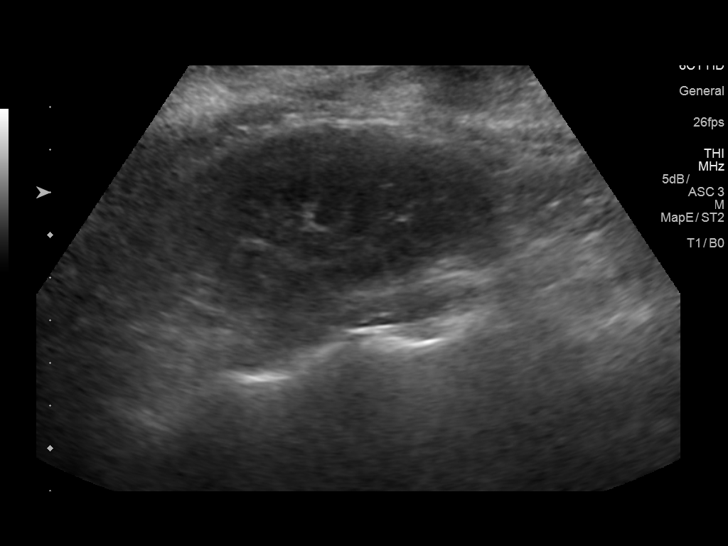
[im 89/98]
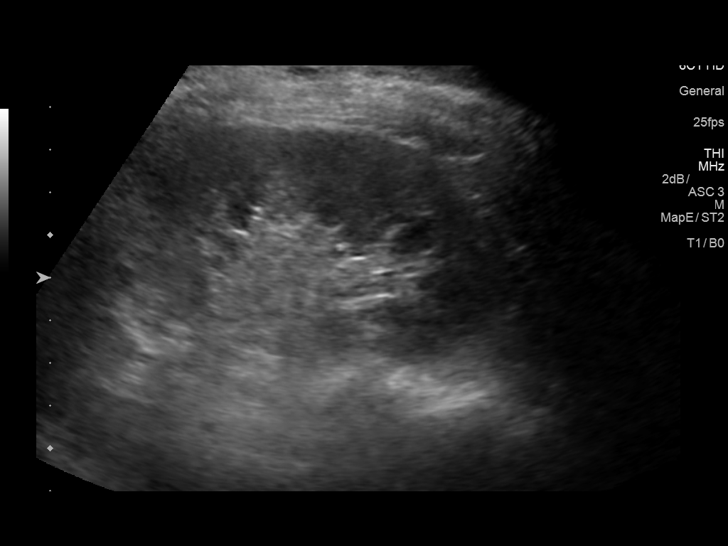
[im 98/98]
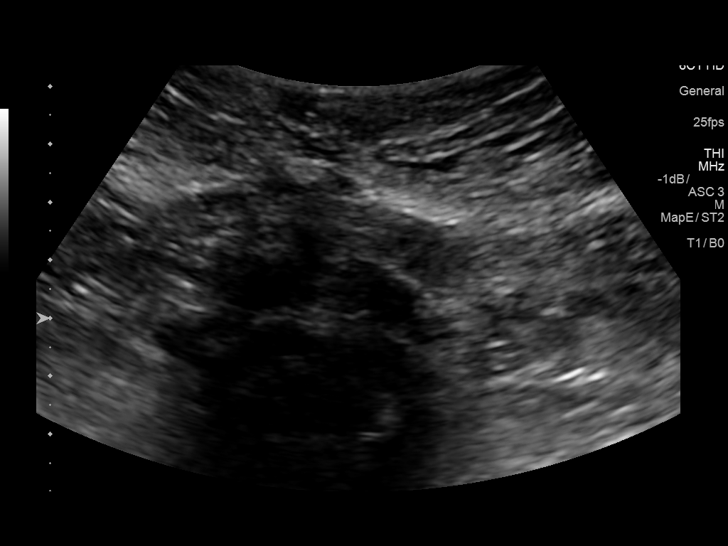

[14 of 25 positions shown; findings below may reference images not displayed]

FINDINGS: Gallbladder: The gallbladder is adequately distended. There are
echogenic stones present measuring up to 1.4 cm in diameter. There
is no gallbladder wall thickening or pericholecystic fluid. There is
no positive sonographic Murphy sign.

Common bile duct: Diameter: 2.9 mm

Liver: No focal lesion identified. Within normal limits in
parenchymal echogenicity.

IVC: No abnormality visualized.

Pancreas: Visualized portion unremarkable.

Spleen: Size and appearance within normal limits.

Right Kidney: Length: 10.8 cm. Echogenicity within normal limits. No
mass or hydronephrosis visualized.

Left Kidney: Length: 10.6 cm. Echogenicity within normal limits. No
mass or hydronephrosis visualized.

Abdominal aorta: No aneurysm visualized.

Other findings: There is no ascites.
IMPRESSION: Gallstones without sonographic evidence of acute cholecystitis.

No acute abnormality observed elsewhere within the abdomen.

## 2018-12-24 ENCOUNTER — Other Ambulatory Visit: Payer: Self-pay

## 2019-06-02 ENCOUNTER — Other Ambulatory Visit: Payer: Self-pay

## 2019-06-03 ENCOUNTER — Encounter: Payer: Self-pay | Admitting: Adult Health

## 2019-06-03 ENCOUNTER — Ambulatory Visit (INDEPENDENT_AMBULATORY_CARE_PROVIDER_SITE_OTHER): Payer: Medicare Other | Admitting: Adult Health

## 2019-06-03 VITALS — BP 146/80 | Temp 97.4°F | Wt 144.0 lb

## 2019-06-03 DIAGNOSIS — R55 Syncope and collapse: Secondary | ICD-10-CM

## 2019-06-03 NOTE — Patient Instructions (Addendum)
Please cut back on diet cokes and drink more water  I will check some labs and someone will call you to schedule your carotid artery ultrasound

## 2019-06-03 NOTE — Progress Notes (Signed)
Subjective:    Patient ID: Cheryl Franklin, female    DOB: September 17, 1940, 79 y.o.   MRN: [id number]  HPI 79 year old female who  has a past medical history of Allergy, Baker's cyst, GERD (gastroesophageal reflux disease), and OA (osteoarthritis).   She presents to the office today for concern of " dizziness".  Per patient report in the past she has been diagnosed with vertigo.  She feels that over the last few months she has started to feel "more dizzy" and feels as though she is going to pass out but has never had a syncopal episode.  This is happening almost on a daily basis but only lasts for less than a minute at a time.  Patient states "I can be standing at the [place] in the kitchen and all momentarily feel like I am going to pass out, all hold onto something and the feeling goes away".  She denies feeling as though the room is spinning or that she is off balance.  Her blood pressures at home have been in the low 140s over 80s  She is staying hydrated with diet Coke  She denies nausea, vomiting, blurred vision, headaches, chest pain, or shortness of breath.   Review of Systems See HPI  Past Medical History:  Diagnosis Date  . Allergy   . Baker's cyst   . GERD (gastroesophageal reflux disease)   . OA (osteoarthritis)    no per pt    Social History   Socioeconomic History  . Marital status: Widowed    Spouse name: Not on file  . Number of children: Not on file  . Years of education: Not on file  . Highest education level: Not on file  Occupational History  . Not on file  Tobacco Use  . Smoking status: Never Smoker  . Smokeless tobacco: Never Used  Substance and Sexual Activity  . Alcohol use: No  . Drug use: No  . Sexual activity: Not on file  Other Topics Concern  . Not on file  Social History Narrative   Lives alone in [place] style home with drive in garage downstairs, so she walks up stairs to her living space   Plans to age in place    No falls; no problem with stairs     Has 2 children who live near    3 grandchildren live near;    2 grands live away; one in  [place]; one going to  [employer] this summer   One going to [employer] in  [place] Strain:   . Difficulty of Paying Living Expenses:   Food Insecurity:   . Worried About Charity fundraiser in the Last Year:   . Arboriculturist in the Last Year:   Transportation Needs:   . Film/video editor (Medical):   Marland Kitchen Lack of Transportation (Non-Medical):   Physical Activity:   . Days of Exercise per Week:   . Minutes of Exercise per Session:   Stress:   . Feeling of Stress :   Social Connections:   . Frequency of Communication with Friends and Family:   . Frequency of Social Gatherings with Friends and Family:   . Attends Religious Services:   . Active Member of Clubs or Organizations:   . Attends Archivist Meetings:   Marland Kitchen Marital Status:   Intimate Partner Violence:   . Fear of Current or Ex-Partner:   .  Emotionally Abused:   Marland Kitchen Physically Abused:   . Sexually Abused:     Past Surgical History:  Procedure Laterality Date  . APPENDECTOMY    . COLONOSCOPY    . TUBAL LIGATION      Family History  Problem Relation Age of Onset  . Diabetes Other   . Stroke Other   . Heart disease Other   . Hypertension Other   . Heart disease Mother   . Heart disease Father   . Diabetes Father   . Colon cancer Neg Hx   . Esophageal cancer Neg Hx   . Stomach cancer Neg Hx   . Rectal cancer Neg Hx     No Known Allergies  Current Outpatient Medications on File Prior to Visit  Medication Sig Dispense Refill  . aspirin 325 MG tablet Take 325 mg by mouth daily.     Marland Kitchen BIOTIN PO Take by mouth.    . Calcium Carbonate (CALCIUM 600 PO) Take 1 tablet by mouth daily.    Marland Kitchen omeprazole (PRILOSEC OTC) 20 MG tablet Take 1 tablet (20 mg total) by mouth as needed. 100 tablet 4   No current facility-administered medications on file prior to  visit.    BP (!) 146/80   Temp (!) 97.4 F (36.3 C) (Temporal)   Wt 144 lb (65.3 kg)   BMI 24.72 kg/m       Objective:   Physical Exam Vitals and nursing note reviewed.  Constitutional:      General: She is not in acute distress.    Appearance: Normal appearance. She is well-developed. She is not ill-appearing.  HENT:     Head: Normocephalic and atraumatic.     Right Ear: Ear canal and external ear normal. A middle ear effusion is present. There is no impacted cerumen. Tympanic membrane is not bulging.     Left Ear: Ear canal and external ear normal.  No middle ear effusion. There is no impacted cerumen. Tympanic membrane is not bulging.     Nose: Nose normal. No congestion or rhinorrhea.  Eyes:     General:        Right eye: No discharge.        Left eye: No discharge.     Extraocular Movements: Extraocular movements intact.     Right eye: Normal extraocular motion and no nystagmus.     Left eye: Normal extraocular motion and no nystagmus.     Conjunctiva/sclera: Conjunctivae normal.     Pupils: Pupils are equal, round, and reactive to light.  Neck:     Thyroid: No thyromegaly.     Vascular: No carotid bruit.     Trachea: No tracheal deviation.  Cardiovascular:     Rate and Rhythm: Normal rate and regular rhythm.     Pulses: Normal pulses.     Heart sounds: Normal heart sounds. No murmur. No friction rub. No gallop.   Pulmonary:     Effort: Pulmonary effort is normal. No respiratory distress.     Breath sounds: Normal breath sounds. No stridor. No wheezing, rhonchi or rales.  Chest:     Chest wall: No tenderness.  Abdominal:     General: Abdomen is flat. Bowel sounds are normal. There is no distension.     Palpations: Abdomen is soft. There is no mass.     Tenderness: There is no abdominal tenderness. There is no right CVA tenderness, left CVA tenderness, guarding or rebound.     Hernia: No hernia is present.  Musculoskeletal:        General: No swelling,  tenderness, deformity or signs of injury. Normal range of motion.     Cervical back: Normal range of motion and neck supple.     Right lower leg: No edema.     Left lower leg: No edema.  Lymphadenopathy:     Cervical: No cervical adenopathy.  Skin:    General: Skin is warm and dry.     Coloration: Skin is not jaundiced or pale.     Findings: No bruising, erythema, lesion or rash.  Neurological:     General: No focal deficit present.     Mental Status: She is alert and oriented to person, place, and time.     Cranial Nerves: No cranial nerve deficit.     Sensory: No sensory deficit.     Motor: No weakness.     Coordination: Coordination normal.     Gait: Gait normal.     Deep Tendon Reflexes: Reflexes normal.  Psychiatric:        Mood and Affect: Mood normal.        Behavior: Behavior normal.        Thought Content: Thought content normal.        Judgment: Judgment normal.        Assessment & Plan:  1. Pre-syncope Appears to be more presyncope rather than vertigo rather than disequilibrium.  She does have a history of hyperlipidemia but does not want to go on a statin.  We will check carotid artery ultrasound and do some lab work.  She was advised to cut back on diet Coke and drink more water.  Can consider echocardiogram or cardiology referral in the future - CBC with Differential/Platelet - TSH - Basic Metabolic Panel - IBC + Ferritin - US Carotid Duplex Bilateral; Future   [name], NP

## 2019-06-04 LAB — CBC WITH DIFFERENTIAL/PLATELET
Basophils Absolute: 0.1 10*3/uL (ref 0.0–0.1)
Basophils Relative: 1.5 % (ref 0.0–3.0)
Eosinophils Absolute: 0.3 10*3/uL (ref 0.0–0.7)
Eosinophils Relative: 4.5 % (ref 0.0–5.0)
HCT: 38.9 % (ref 36.0–46.0)
Hemoglobin: 12.8 g/dL (ref 12.0–15.0)
Lymphocytes Relative: 36.4 % (ref 12.0–46.0)
Lymphs Abs: 2.4 10*3/uL (ref 0.7–4.0)
MCHC: 32.9 g/dL (ref 30.0–36.0)
MCV: 94.7 fl (ref 78.0–100.0)
Monocytes Absolute: 0.4 10*3/uL (ref 0.1–1.0)
Monocytes Relative: 6.3 % (ref 3.0–12.0)
Neutro Abs: 3.4 10*3/uL (ref 1.4–7.7)
Neutrophils Relative %: 51.3 % (ref 43.0–77.0)
Platelets: 242 10*3/uL (ref 150.0–400.0)
RBC: 4.11 Mil/uL (ref 3.87–5.11)
RDW: 14.5 % (ref 11.5–15.5)
WBC: 6.6 10*3/uL (ref 4.0–10.5)

## 2019-06-04 LAB — BASIC METABOLIC PANEL
BUN: 13 mg/dL (ref 6–23)
CO2: 27 mEq/L (ref 19–32)
Calcium: 9.3 mg/dL (ref 8.4–10.5)
Chloride: 104 mEq/L (ref 96–112)
Creatinine, Ser: 0.75 mg/dL (ref 0.40–1.20)
GFR: 74.72 mL/min (ref >60.00)
Glucose, Bld: 74 mg/dL (ref 70–99)
Potassium: 3.9 mEq/L (ref 3.5–5.1)
Sodium: 139 mEq/L (ref 135–145)

## 2019-06-04 LAB — IBC + FERRITIN
Ferritin: 43 ng/mL (ref 10.0–291.0)
Iron: 52 ug/dL (ref 42–145)
Saturation Ratios: 15.5 % — ABNORMAL LOW (ref 20.0–50.0)
Transferrin: 240 mg/dL (ref 212.0–360.0)

## 2019-06-04 LAB — TSH: TSH: 4.06 u[IU]/mL (ref 0.35–4.50)

## 2019-09-05 DIAGNOSIS — Z20822 Contact with and (suspected) exposure to covid-19: Secondary | ICD-10-CM | POA: Diagnosis not present

## 2019-11-17 ENCOUNTER — Other Ambulatory Visit: Payer: Self-pay

## 2019-11-17 ENCOUNTER — Encounter: Payer: Self-pay | Admitting: Adult Health

## 2019-11-17 ENCOUNTER — Ambulatory Visit (INDEPENDENT_AMBULATORY_CARE_PROVIDER_SITE_OTHER): Payer: Medicare Other | Admitting: Adult Health

## 2019-11-17 ENCOUNTER — Ambulatory Visit (INDEPENDENT_AMBULATORY_CARE_PROVIDER_SITE_OTHER): Payer: Medicare Other

## 2019-11-17 VITALS — BP 136/78 | HR 72 | Temp 98.2°F | Ht 64.0 in | Wt 140.4 lb

## 2019-11-17 DIAGNOSIS — R109 Unspecified abdominal pain: Secondary | ICD-10-CM

## 2019-11-17 NOTE — Patient Instructions (Addendum)
It was great seeing you   I will follow up with you regarding your xray, likely you bruised a rib

## 2019-11-17 NOTE — Progress Notes (Signed)
Subjective:    Patient ID: Cheryl Franklin, female    DOB: [birth year], 79 y.o.   MRN: [id number]  HPI 79 year old female who  has a past medical history of Allergy, Baker's cyst, GERD (gastroesophageal reflux disease), and OA (osteoarthritis).  She presents to the office today for concern of left flank pain. She reports that she slipped at home and when she fell she fell onto a four 5 gallon paint bucket.  She fell onto her left flank.  She denies hitting her head or any other part of her body.  It was happened about 9 days ago, thankfully her discomfort is getting better and that she wants to make sure that she does not have a fractured rib.  She does endorse pain with palpation but does not have discomfort with deep breathing or twisting or turning.  He denies shortness of breath or chest pain  She has been using OTC pain medication which helps    Review of Systems See HPI   Past Medical History:  Diagnosis Date  . Allergy   . Baker's cyst   . GERD (gastroesophageal reflux disease)   . OA (osteoarthritis)    no per pt    Social History   Socioeconomic History  . Marital status: Widowed    Spouse name: Not on file  . Number of children: Not on file  . Years of education: Not on file  . Highest education level: Not on file  Occupational History  . Not on file  Tobacco Use  . Smoking status: Never Smoker  . Smokeless tobacco: Never Used  Vaping Use  . Vaping Use: Never used  Substance and Sexual Activity  . Alcohol use: No  . Drug use: No  . Sexual activity: Not on file  Other Topics Concern  . Not on file  Social History Narrative   Lives alone in [place] style home with drive in garage downstairs, so she walks up stairs to her living space   Plans to age in place    No falls; no problem with stairs    Has 2 children who live near    3 grandchildren live near;    2 grands live away; one in  [place]; one going to  [employer] this summer   One going to [employer] in   [place] Strain:   . Difficulty of Paying Living Expenses: Not on file  Food Insecurity:   . Worried About Charity fundraiser in the Last Year: Not on file  . Ran Out of Food in the Last Year: Not on file  Transportation Needs:   . Lack of Transportation (Medical): Not on file  . Lack of Transportation (Non-Medical): Not on file  Physical Activity:   . Days of Exercise per Week: Not on file  . Minutes of Exercise per Session: Not on file  Stress:   . Feeling of Stress : Not on file  Social Connections:   . Frequency of Communication with Friends and Family: Not on file  . Frequency of Social Gatherings with Friends and Family: Not on file  . Attends Religious Services: Not on file  . Active Member of Clubs or Organizations: Not on file  . Attends Archivist Meetings: Not on file  . Marital Status: Not on file  Intimate Partner Violence:   . Fear of Current or Ex-Partner: Not on file  . Emotionally  Abused: Not on file  . Physically Abused: Not on file  . Sexually Abused: Not on file    Past Surgical History:  Procedure Laterality Date  . APPENDECTOMY    . COLONOSCOPY    . TUBAL LIGATION      Family History  Problem Relation Age of Onset  . Diabetes Other   . Stroke Other   . Heart disease Other   . Hypertension Other   . Heart disease Mother   . Heart disease Father   . Diabetes Father   . Colon cancer Neg Hx   . Esophageal cancer Neg Hx   . Stomach cancer Neg Hx   . Rectal cancer Neg Hx     No Known Allergies  Current Outpatient Medications on File Prior to Visit  Medication Sig Dispense Refill  . aspirin 325 MG tablet Take 325 mg by mouth daily.     Marland Kitchen BIOTIN PO Take by mouth.    . Calcium Carbonate (CALCIUM 600 PO) Take 1 tablet by mouth daily.    Marland Kitchen omeprazole (PRILOSEC OTC) 20 MG tablet Take 1 tablet (20 mg total) by mouth as needed. 100 tablet 4   No current facility-administered  medications on file prior to visit.    BP 136/78 (BP Location: Left Arm, Patient Position: Sitting, Cuff Size: Normal)   Pulse 72   Temp 98.2 F (36.8 C) (Oral)   Ht 5\' 4"  (1.626 m)   Wt 140 lb 6.4 oz (63.7 kg)   SpO2 97%   BMI 24.10 kg/m       Objective:   Physical Exam Vitals and nursing note reviewed.  Constitutional:      Appearance: Normal appearance.  Cardiovascular:     Rate and Rhythm: Normal rate and regular rhythm.     Pulses: Normal pulses.     Heart sounds: Normal heart sounds.  Pulmonary:     Effort: Pulmonary effort is normal.     Breath sounds: Normal breath sounds.  Musculoskeletal:        General: Tenderness (mild tenderness with palpation to bottom three ribs on left flank ) present. Normal range of motion.  Skin:    General: Skin is warm and dry.     Findings: Bruising (2 inch bruise on left flank ) present.  Neurological:     General: No focal deficit present.     Mental Status: She is alert and oriented to person, place, and time.  Psychiatric:        Mood and Affect: Mood normal.        Behavior: Behavior normal.        Thought Content: Thought content normal.        Judgment: Judgment normal.       Assessment & Plan:  1. Left flank pain - Likely bruised ribs but will r/o fracture - Continue with OTC pain medication  - Can use heating pad - DG Chest 2 View; Future  [name], NP

## 2019-11-19 ENCOUNTER — Ambulatory Visit: Payer: Medicare Other | Admitting: Adult Health

## 2019-12-15 ENCOUNTER — Other Ambulatory Visit: Payer: Self-pay

## 2019-12-15 ENCOUNTER — Ambulatory Visit (HOSPITAL_COMMUNITY)
Admission: RE | Admit: 2019-12-15 | Discharge: 2019-12-15 | Disposition: A | Discharge status: Home / Self Care | Payer: Medicare Other | Source: Ambulatory Visit | Attending: Cardiovascular Disease | Admitting: Cardiovascular Disease

## 2019-12-15 DIAGNOSIS — R55 Syncope and collapse: Secondary | ICD-10-CM | POA: Diagnosis not present

## 2019-12-17 DIAGNOSIS — Z23 Encounter for immunization: Secondary | ICD-10-CM | POA: Diagnosis not present

## 2020-01-12 ENCOUNTER — Encounter: Payer: Self-pay | Admitting: Adult Health

## 2020-01-12 ENCOUNTER — Other Ambulatory Visit: Payer: Self-pay

## 2020-01-12 ENCOUNTER — Ambulatory Visit (INDEPENDENT_AMBULATORY_CARE_PROVIDER_SITE_OTHER): Payer: Medicare Other | Admitting: Adult Health

## 2020-01-12 VITALS — BP 132/80 | HR 72 | Temp 98.2°F | Ht 64.0 in | Wt 140.0 lb

## 2020-01-12 DIAGNOSIS — K219 Gastro-esophageal reflux disease without esophagitis: Secondary | ICD-10-CM

## 2020-01-12 DIAGNOSIS — E7849 Other hyperlipidemia: Secondary | ICD-10-CM | POA: Diagnosis not present

## 2020-01-12 DIAGNOSIS — M810 Age-related osteoporosis without current pathological fracture: Secondary | ICD-10-CM | POA: Diagnosis not present

## 2020-01-12 NOTE — Progress Notes (Signed)
Subjective:    Patient ID: Cheryl Franklin, female    DOB: 11-04-1940, 79 y.o.   MRN: [id number]  HPI Patient presents for yearly preventative medicine examination. She is a pleasant 79 year old female who  has a past medical history of Allergy, Baker's cyst, GERD (gastroesophageal reflux disease), and OA (osteoarthritis).  GERD - uses Prilosec PRN   Osteoarthritis - chronic knee pain. Refuses other treatment options.   Hyperlipidemia-currently on any medication.  She refuses statin last year we wanted to try lifestyle modifications first Lab Results  Component Value Date   CHOL 228 (H) 07/03/2018   HDL 69.30 07/03/2018   LDLCALC 143 (H) 07/03/2018   LDLDIRECT 123.7 04/16/2011   TRIG 80.0 07/03/2018   CHOLHDL 3 07/03/2018   All immunizations and health maintenance protocols were reviewed with the patient and needed orders were placed.  Appropriate screening laboratory values were ordered for the patient including screening of hyperlipidemia, renal function and hepatic function.  Medication reconciliation,  past medical history, social history, problem list and allergies were reviewed in detail with the patient  Goals were established with regard to weight loss, exercise, and  diet in compliance with medications. She enjoys staying active with walking and going to the gym. She tries to eat healthy  Review of Systems  Constitutional: Negative.   HENT: Negative.   Eyes: Negative.   Respiratory: Negative.   Cardiovascular: Negative.   Gastrointestinal: Negative.   Endocrine: Negative.   Genitourinary: Negative.   Musculoskeletal: Negative.   Skin: Negative.   Allergic/Immunologic: Negative.   Neurological: Negative.   Hematological: Negative.   Psychiatric/Behavioral: Negative.    Past Medical History:  Diagnosis Date  . Allergy   . Baker's cyst   . GERD (gastroesophageal reflux disease)   . OA (osteoarthritis)    no per pt    Social History   Socioeconomic History  .  Marital status: Widowed    Spouse name: Not on file  . Number of children: Not on file  . Years of education: Not on file  . Highest education level: Not on file  Occupational History  . Not on file  Tobacco Use  . Smoking status: Never Smoker  . Smokeless tobacco: Never Used  Vaping Use  . Vaping Use: Never used  Substance and Sexual Activity  . Alcohol use: No  . Drug use: No  . Sexual activity: Not on file  Other Topics Concern  . Not on file  Social History Narrative   Lives alone in [place] style home with drive in garage downstairs, so she walks up stairs to her living space   Plans to age in place    No falls; no problem with stairs    Has 2 children who live near    3 grandchildren live near;    2 grands live away; one in  [place]; one going to  [employer] this summer   One going to [employer] in  [place] Strain: Not on file  Food Insecurity: Not on file  Transportation Needs: Not on file  Physical Activity: Not on file  Stress: Not on file  Social Connections: Not on file  Intimate Partner Violence: Not on file    Past Surgical History:  Procedure Laterality Date  . APPENDECTOMY    . COLONOSCOPY    . TUBAL LIGATION      Family History  Problem Relation Age of Onset  .  Diabetes Other   . Stroke Other   . Heart disease Other   . Hypertension Other   . Heart disease Mother   . Heart disease Father   . Diabetes Father   . Colon cancer Neg Hx   . Esophageal cancer Neg Hx   . Stomach cancer Neg Hx   . Rectal cancer Neg Hx     No Known Allergies  Current Outpatient Medications on File Prior to Visit  Medication Sig Dispense Refill  . aspirin 325 MG tablet Take 325 mg by mouth daily.     Marland Kitchen BIOTIN PO Take by mouth.    . Calcium Carbonate (CALCIUM 600 PO) Take 1 tablet by mouth daily.    Marland Kitchen omeprazole (PRILOSEC OTC) 20 MG tablet Take 1 tablet (20 mg total) by mouth as needed. 100 tablet 4    No current facility-administered medications on file prior to visit.    There were no vitals taken for this visit.      Objective:   Physical Exam Vitals and nursing note reviewed.  Constitutional:      General: She is not in acute distress.    Appearance: Normal appearance. She is well-developed. She is not ill-appearing.  HENT:     Head: Normocephalic and atraumatic.     Right Ear: Tympanic membrane, ear canal and external ear normal. There is no impacted cerumen.     Left Ear: Tympanic membrane, ear canal and external ear normal. There is no impacted cerumen.     Nose: Nose normal. No congestion or rhinorrhea.     Mouth/Throat:     Mouth: Mucous membranes are moist.     Pharynx: Oropharynx is clear. No oropharyngeal exudate or posterior oropharyngeal erythema.  Eyes:     General:        Right eye: No discharge.        Left eye: No discharge.     Extraocular Movements: Extraocular movements intact.     Conjunctiva/sclera: Conjunctivae normal.     Pupils: Pupils are equal, round, and reactive to light.  Neck:     Thyroid: No thyromegaly.     Vascular: No carotid bruit.     Trachea: No tracheal deviation.  Cardiovascular:     Rate and Rhythm: Normal rate and regular rhythm.     Pulses: Normal pulses.     Heart sounds: Normal heart sounds. No murmur heard. No friction rub. No gallop.   Pulmonary:     Effort: Pulmonary effort is normal. No respiratory distress.     Breath sounds: Normal breath sounds. No stridor. No wheezing, rhonchi or rales.  Chest:     Chest wall: No tenderness.  Abdominal:     General: Abdomen is flat. Bowel sounds are normal. There is no distension.     Palpations: Abdomen is soft. There is no mass.     Tenderness: There is no abdominal tenderness. There is no right CVA tenderness, left CVA tenderness, guarding or rebound.     Hernia: No hernia is present.  Musculoskeletal:        General: No swelling, tenderness, deformity or signs of injury.  Normal range of motion.     Cervical back: Normal range of motion and neck supple.     Right lower leg: No edema.     Left lower leg: No edema.  Lymphadenopathy:     Cervical: No cervical adenopathy.  Skin:    General: Skin is warm and dry.     Coloration:  Skin is not jaundiced or pale.     Findings: No bruising, erythema, lesion or rash.  Neurological:     General: No focal deficit present.     Mental Status: She is alert and oriented to person, place, and time.     Cranial Nerves: No cranial nerve deficit.     Sensory: No sensory deficit.     Motor: No weakness.     Coordination: Coordination normal.     Gait: Gait normal.     Deep Tendon Reflexes: Reflexes normal.  Psychiatric:        Mood and Affect: Mood normal.        Behavior: Behavior normal.        Thought Content: Thought content normal.        Judgment: Judgment normal.       Assessment & Plan:  1. Gastroesophageal reflux disease without esophagitis - Continue with prilosec as directed  - CBC with Differential/Platelet; Future - Lipid panel; Future - TSH; Future - CMP with eGFR([facility]); Future  2. Age-related osteoporosis without current pathological fracture - Continue to monitor   3. Other hyperlipidemia - Consider statin  - CBC with Differential/Platelet; Future - Lipid panel; Future - TSH; Future - CMP with eGFR([facility]); Future  [name], NP

## 2020-01-12 NOTE — Patient Instructions (Signed)
It was great seeing you today   We will follow up with you regarding your blood work   I will see you back in one year or sooner if needed 

## 2020-01-13 LAB — COMPLETE METABOLIC PANEL WITH GFR
AG Ratio: 1.5 (calc) (ref 1.0–2.5)
ALT: 11 U/L (ref 6–29)
AST: 16 U/L (ref 10–35)
Albumin: 4.3 g/dL (ref 3.6–5.1)
Alkaline phosphatase (APISO): 84 U/L (ref 37–153)
BUN: 10 mg/dL (ref 7–25)
CO2: 29 mmol/L (ref 20–32)
Calcium: 9.7 mg/dL (ref 8.6–10.4)
Chloride: 106 mmol/L (ref 98–110)
Creat: 0.62 mg/dL (ref 0.60–0.93)
GFR, Est African American: 100 mL/min/{1.73_m2} (ref >=60)
GFR, Est Non African American: 86 mL/min/{1.73_m2} (ref >=60)
Globulin: 2.9 g/dL (calc) (ref 1.9–3.7)
Glucose, Bld: 82 mg/dL (ref 65–99)
Potassium: 5.4 mmol/L — ABNORMAL HIGH (ref 3.5–5.3)
Sodium: 142 mmol/L (ref 135–146)
Total Bilirubin: 0.4 mg/dL (ref 0.2–1.2)
Total Protein: 7.2 g/dL (ref 6.1–8.1)

## 2020-01-13 LAB — LIPID PANEL
Cholesterol: 210 mg/dL — ABNORMAL HIGH (ref <200)
HDL: 63 mg/dL (ref >=50)
LDL Cholesterol (Calc): 126 mg/dL (calc) — ABNORMAL HIGH
Non-HDL Cholesterol (Calc): 147 mg/dL (calc) — ABNORMAL HIGH (ref <130)
Total CHOL/HDL Ratio: 3.3 (calc) (ref <5.0)
Triglycerides: 105 mg/dL (ref <150)

## 2020-01-13 LAB — TSH: TSH: 3.7 mIU/L (ref 0.40–4.50)

## 2020-01-13 LAB — CBC WITH DIFFERENTIAL/PLATELET

## 2020-02-24 DIAGNOSIS — Z961 Presence of intraocular lens: Secondary | ICD-10-CM | POA: Diagnosis not present

## 2020-02-24 DIAGNOSIS — H31113 Age-related choroidal atrophy, bilateral: Secondary | ICD-10-CM | POA: Diagnosis not present

## 2020-02-24 DIAGNOSIS — H04203 Unspecified epiphora, bilateral lacrimal glands: Secondary | ICD-10-CM | POA: Diagnosis not present

## 2020-02-24 DIAGNOSIS — H26493 Other secondary cataract, bilateral: Secondary | ICD-10-CM | POA: Diagnosis not present

## 2020-02-24 DIAGNOSIS — H43393 Other vitreous opacities, bilateral: Secondary | ICD-10-CM | POA: Diagnosis not present

## 2020-04-08 DIAGNOSIS — H04203 Unspecified epiphora, bilateral lacrimal glands: Secondary | ICD-10-CM | POA: Diagnosis not present

## 2020-05-17 ENCOUNTER — Other Ambulatory Visit: Payer: Self-pay

## 2020-05-17 ENCOUNTER — Encounter: Payer: Self-pay | Admitting: Orthopaedic Surgery

## 2020-05-17 ENCOUNTER — Ambulatory Visit (INDEPENDENT_AMBULATORY_CARE_PROVIDER_SITE_OTHER): Payer: Medicare Other | Admitting: Orthopaedic Surgery

## 2020-05-17 ENCOUNTER — Ambulatory Visit (INDEPENDENT_AMBULATORY_CARE_PROVIDER_SITE_OTHER): Payer: Medicare Other

## 2020-05-17 VITALS — Ht 64.0 in | Wt 140.0 lb

## 2020-05-17 DIAGNOSIS — G8929 Other chronic pain: Secondary | ICD-10-CM

## 2020-05-17 DIAGNOSIS — M25562 Pain in left knee: Secondary | ICD-10-CM | POA: Diagnosis not present

## 2020-05-17 DIAGNOSIS — M1712 Unilateral primary osteoarthritis, left knee: Secondary | ICD-10-CM | POA: Insufficient documentation

## 2020-05-17 MED ORDER — METHYLPREDNISOLONE ACETATE 40 MG/ML IJ SUSP
80.0000 mg | INTRAMUSCULAR | Status: AC | PRN
  Administered 2020-05-17: 80 mg via INTRA_ARTICULAR

## 2020-05-17 MED ORDER — LIDOCAINE HCL 1 % IJ SOLN
2.0000 mL | INTRAMUSCULAR | Status: AC | PRN
  Administered 2020-05-17: 2 mL

## 2020-05-17 MED ORDER — BUPIVACAINE HCL 0.25 % IJ SOLN
2.0000 mL | INTRAMUSCULAR | Status: AC | PRN
  Administered 2020-05-17: 2 mL via INTRA_ARTICULAR

## 2020-05-17 NOTE — Progress Notes (Signed)
Office Visit Note   Patient: Cheryl Franklin           Date of Birth: 29-Aug-1940           MRN: [id number] Visit Date: 05/17/2020              Requested by: [name], NP [address],  [place] [address] PCP: [name], NP   Assessment & Plan: Visit Diagnoses:  1. Chronic pain of left knee   2. Unilateral primary osteoarthritis, left knee     Plan: Mrs. Barco has osteoarthritis of her left knee predominantly in the lateral compartment.  There is near bone-on-bone with about 8 degrees of valgus.  Long discussion regarding different treatment options.  She like to try cortisone injection.  She is fully aware that this may be temporary.  I have also talked about exercises, NSAIDs and bracing.  I briefly discussed knee replacement but she is "not ready for that yet"  Follow-Up Instructions: Return if symptoms worsen or fail to improve.   Orders:  Orders Placed This Encounter  Procedures  . Large Joint Inj: L knee  . XR KNEE 3 VIEW LEFT   No orders of the defined types were placed in this encounter.     Procedures: Large Joint Inj: L knee on 05/17/2020 2:55 PM Indications: pain and diagnostic evaluation Details: 25 G 1.5 in needle, anteromedial approach  Arthrogram: No  Medications: 2 mL lidocaine 1 %; 80 mg methylPREDNISolone acetate 40 MG/ML; 2 mL bupivacaine 0.25 % Procedure, treatment alternatives, risks and benefits explained, specific risks discussed. Consent was given by the patient. Patient was prepped and draped in the usual sterile fashion.       Clinical Data: No additional findings.   Subjective: Chief Complaint  Patient presents with  . Left Knee - Pain  Patient presents today for left knee pain. She said that it has been hurting for years, but recently gotten worse. No injury. Her pain is worse behind her knee. She said that it swells, grinds, and feels weak. She walks for exercise and cannot tell that it makes it hurt any worse. She  takes over the counter pain medicine as needed.   HPI  Review of Systems   Objective: Vital Signs: Ht 5\' 4"  (1.626 m)   Wt 140 lb (63.5 kg)   BMI 24.03 kg/m   Physical Exam Constitutional:      Appearance: She is well-developed.  Eyes:     Pupils: Pupils are equal, round, and reactive to light.  Pulmonary:     Effort: Pulmonary effort is normal.  Skin:    General: Skin is warm and dry.  Neurological:     Mental Status: She is alert and oriented to person, place, and time.  Psychiatric:        Behavior: Behavior normal.     Ortho Exam awake alert and oriented x3.  Comfortable sitting.  Left knee with increased valgus with weightbearing.  The knee was not hot red warm or effused.  No opening with varus valgus stress.  I could just about reposition the knee to neutral passively.  Mild patella crepitation but no pain with compression.  No pain medially.  No popliteal cyst or mass.  No calf pain.  No distal edema.  Full extension and over 105 degrees of flexion Specialty Comments:  No specialty comments available.  Imaging: XR KNEE 3 VIEW LEFT  Result Date: 05/17/2020 Films of the left knee were obtained in  3 projections standing.  There is near bone-on-bone in the lateral compartment with subchondral sclerosis and peripheral osteophytes.  The medial compartment looks relatively clear.  There is about 8 degrees of valgus.  There are some degenerative changes of the patellofemoral joint more lateral than medial but the patella tracks in the midline.    PMFS History: Patient Active Problem List   Diagnosis Date Noted  . Unilateral primary osteoarthritis, left knee 05/17/2020  . Routine general medical examination at a health care facility 03/01/2015  . Actinic keratoses 05/08/2011  . SEBORRHEIC KERATOSIS, INFLAMED 06/09/2009  . Osteoporosis 01/07/2007  . ALLERGIC RHINITIS 10/07/2006  . GERD 10/07/2006   Past Medical History:  Diagnosis Date  . Allergy   . Baker's cyst    . GERD (gastroesophageal reflux disease)   . OA (osteoarthritis)    no per pt    Family History  Problem Relation Age of Onset  . Diabetes Other   . Stroke Other   . Heart disease Other   . Hypertension Other   . Heart disease Mother   . Heart disease Father   . Diabetes Father   . Colon cancer Neg Hx   . Esophageal cancer Neg Hx   . Stomach cancer Neg Hx   . Rectal cancer Neg Hx     Past Surgical History:  Procedure Laterality Date  . APPENDECTOMY    . COLONOSCOPY    . TUBAL LIGATION     Social History   Occupational History  . Not on file  Tobacco Use  . Smoking status: Never Smoker  . Smokeless tobacco: Never Used  Vaping Use  . Vaping Use: Never used  Substance and Sexual Activity  . Alcohol use: No  . Drug use: No  . Sexual activity: Not on file

## 2020-09-13 DIAGNOSIS — D225 Melanocytic nevi of trunk: Secondary | ICD-10-CM | POA: Diagnosis not present

## 2020-09-13 DIAGNOSIS — L570 Actinic keratosis: Secondary | ICD-10-CM | POA: Diagnosis not present

## 2020-09-13 DIAGNOSIS — D2239 Melanocytic nevi of other parts of face: Secondary | ICD-10-CM | POA: Diagnosis not present

## 2020-09-13 DIAGNOSIS — L309 Dermatitis, unspecified: Secondary | ICD-10-CM | POA: Diagnosis not present

## 2020-09-13 DIAGNOSIS — L820 Inflamed seborrheic keratosis: Secondary | ICD-10-CM | POA: Diagnosis not present

## 2021-01-05 ENCOUNTER — Other Ambulatory Visit: Payer: Self-pay | Admitting: Adult Health

## 2021-01-05 ENCOUNTER — Encounter: Payer: Self-pay | Admitting: Adult Health

## 2021-01-05 ENCOUNTER — Other Ambulatory Visit (INDEPENDENT_AMBULATORY_CARE_PROVIDER_SITE_OTHER): Payer: Medicare Other

## 2021-01-05 DIAGNOSIS — E7849 Other hyperlipidemia: Secondary | ICD-10-CM | POA: Diagnosis not present

## 2021-01-05 DIAGNOSIS — H04203 Unspecified epiphora, bilateral lacrimal glands: Secondary | ICD-10-CM | POA: Diagnosis not present

## 2021-01-05 DIAGNOSIS — H43393 Other vitreous opacities, bilateral: Secondary | ICD-10-CM | POA: Diagnosis not present

## 2021-01-05 DIAGNOSIS — Z961 Presence of intraocular lens: Secondary | ICD-10-CM | POA: Diagnosis not present

## 2021-01-05 DIAGNOSIS — H26493 Other secondary cataract, bilateral: Secondary | ICD-10-CM | POA: Diagnosis not present

## 2021-01-05 DIAGNOSIS — H31113 Age-related choroidal atrophy, bilateral: Secondary | ICD-10-CM | POA: Diagnosis not present

## 2021-01-05 LAB — CBC WITH DIFFERENTIAL/PLATELET
Basophils Absolute: 0 10*3/uL (ref 0.0–0.1)
Basophils Relative: 0.8 % (ref 0.0–3.0)
Eosinophils Absolute: 0.1 10*3/uL (ref 0.0–0.7)
Eosinophils Relative: 2.8 % (ref 0.0–5.0)
HCT: 40.2 % (ref 36.0–46.0)
Hemoglobin: 13.5 g/dL (ref 12.0–15.0)
Lymphocytes Relative: 40.9 % (ref 12.0–46.0)
Lymphs Abs: 1.9 10*3/uL (ref 0.7–4.0)
MCHC: 33.5 g/dL (ref 30.0–36.0)
MCV: 92.5 fl (ref 78.0–100.0)
Monocytes Absolute: 0.3 10*3/uL (ref 0.1–1.0)
Monocytes Relative: 7 % (ref 3.0–12.0)
Neutro Abs: 2.3 10*3/uL (ref 1.4–7.7)
Neutrophils Relative %: 48.5 % (ref 43.0–77.0)
Platelets: 222 10*3/uL (ref 150.0–400.0)
RBC: 4.34 Mil/uL (ref 3.87–5.11)
RDW: 14.4 % (ref 11.5–15.5)
WBC: 4.7 10*3/uL (ref 4.0–10.5)

## 2021-01-05 LAB — COMPREHENSIVE METABOLIC PANEL
ALT: 12 U/L (ref 0–35)
AST: 17 U/L (ref 0–37)
Albumin: 4.2 g/dL (ref 3.5–5.2)
Alkaline Phosphatase: 76 U/L (ref 39–117)
BUN: 12 mg/dL (ref 6–23)
CO2: 29 mEq/L (ref 19–32)
Calcium: 9.5 mg/dL (ref 8.4–10.5)
Chloride: 105 mEq/L (ref 96–112)
Creatinine, Ser: 0.75 mg/dL (ref 0.40–1.20)
GFR: 75.58 mL/min (ref >60.00)
Glucose, Bld: 85 mg/dL (ref 70–99)
Potassium: 4.2 mEq/L (ref 3.5–5.1)
Sodium: 140 mEq/L (ref 135–145)
Total Bilirubin: 0.6 mg/dL (ref 0.2–1.2)
Total Protein: 7.4 g/dL (ref 6.0–8.3)

## 2021-01-05 LAB — LIPID PANEL
Cholesterol: 211 mg/dL — ABNORMAL HIGH (ref 0–200)
HDL: 65.8 mg/dL (ref >39.00)
LDL Cholesterol: 130 mg/dL — ABNORMAL HIGH (ref 0–99)
NonHDL: 145.64
Total CHOL/HDL Ratio: 3
Triglycerides: 79 mg/dL (ref 0.0–149.0)
VLDL: 15.8 mg/dL (ref 0.0–40.0)

## 2021-01-05 LAB — TSH: TSH: 4.29 u[IU]/mL (ref 0.35–5.50)

## 2021-01-05 NOTE — Telephone Encounter (Signed)
Spoke to pt and ask what the reason was for wanting a A1c. Pt stated that Diabetes is in her family hx. Pt advised that her fasting glucose was well with in the recommended range and that she may not need an A1c. Pt verbalized understanding and stated as long as her numbers continue to be within range she is ok with not getting a A1c.

## 2021-01-13 ENCOUNTER — Ambulatory Visit (INDEPENDENT_AMBULATORY_CARE_PROVIDER_SITE_OTHER): Payer: Medicare Other | Admitting: Adult Health

## 2021-01-13 ENCOUNTER — Encounter: Payer: Self-pay | Admitting: Adult Health

## 2021-01-13 VITALS — BP 148/80 | HR 76 | Temp 98.7°F | Ht 64.0 in | Wt 141.0 lb

## 2021-01-13 DIAGNOSIS — K219 Gastro-esophageal reflux disease without esophagitis: Secondary | ICD-10-CM

## 2021-01-13 DIAGNOSIS — E7849 Other hyperlipidemia: Secondary | ICD-10-CM

## 2021-01-13 DIAGNOSIS — M810 Age-related osteoporosis without current pathological fracture: Secondary | ICD-10-CM | POA: Diagnosis not present

## 2021-01-13 NOTE — Patient Instructions (Signed)
It was great seeing you today.   Keep doing what you are doing   I will see you back in one year or sooner if needed. Please let me know if you need anything

## 2021-01-13 NOTE — Progress Notes (Signed)
Subjective:    Patient ID: Cheryl Franklin, female    DOB: 10/15/1940, 80 y.o.   MRN: [id number]  HPI Patient presents for yearly preventative medicine examination. She is a pleasant and remarkable 80 year old female who  has a past medical history of Allergy, Baker's cyst, GERD (gastroesophageal reflux disease), and OA (osteoarthritis).  GERD - uses Prilosec PRN   Hyperlipidemia - refuses medications.  Lab Results  Component Value Date   CHOL 211 (H) 01/05/2021   HDL 65.80 01/05/2021   LDLCALC 130 (H) 01/05/2021   LDLDIRECT 123.7 04/16/2011   TRIG 79.0 01/05/2021   CHOLHDL 3 01/05/2021   Chronic knee pain - from osteoarthritis. Had steroid injection in 04/2020 at orthopedics and reports that the pain had resolved for about six weeks. She is not ready for surgery yet.   All immunizations and health maintenance protocols were reviewed with the patient and needed orders were placed.  Appropriate screening laboratory values were ordered for the patient including screening of hyperlipidemia, renal function and hepatic function.  Medication reconciliation,  past medical history, social history, problem list and allergies were reviewed in detail with the patient  Goals were established with regard to weight loss, exercise, and  diet in compliance with medications.  She was walking and going to the room on a regular basis.  She does try and eat healthy.  Review of Systems  Constitutional: Negative.   HENT: Negative.    Eyes: Negative.   Respiratory: Negative.    Cardiovascular: Negative.   Gastrointestinal: Negative.   Endocrine: Negative.   Genitourinary: Negative.   Musculoskeletal:  Positive for arthralgias.  Skin: Negative.   Allergic/Immunologic: Negative.   Neurological: Negative.   Hematological: Negative.   Psychiatric/Behavioral: Negative.    Past Medical History:  Diagnosis Date   Allergy    Baker's cyst    GERD (gastroesophageal reflux disease)    OA (osteoarthritis)     no per pt    Social History   Socioeconomic History   Marital status: Widowed    Spouse name: Not on file   Number of children: Not on file   Years of education: Not on file   Highest education level: Not on file  Occupational History   Not on file  Tobacco Use   Smoking status: Never   Smokeless tobacco: Never  Vaping Use   Vaping Use: Never used  Substance and Sexual Activity   Alcohol use: No   Drug use: No   Sexual activity: Not on file  Other Topics Concern   Not on file  Social History Narrative   Lives alone in [place] style home with drive in garage downstairs, so she walks up stairs to her living space   Plans to age in place    No falls; no problem with stairs    Has 2 children who live near    3 grandchildren live near;    2 grands live away; one in  [place]; one going to  [employer] this summer   One going to [employer] in  [place] Strain: Not on file  Food Insecurity: Not on file  Transportation Needs: Not on file  Physical Activity: Not on file  Stress: Not on file  Social Connections: Not on file  Intimate Partner Violence: Not on file    Past Surgical History:  Procedure Laterality Date   APPENDECTOMY     COLONOSCOPY  TUBAL LIGATION      Family History  Problem Relation Age of Onset   Diabetes Other    Stroke Other    Heart disease Other    Hypertension Other    Heart disease Mother    Heart disease Father    Diabetes Father    Colon cancer Neg Hx    Esophageal cancer Neg Hx    Stomach cancer Neg Hx    Rectal cancer Neg Hx     No Known Allergies  Current Outpatient Medications on File Prior to Visit  Medication Sig Dispense Refill   Calcium Carbonate (CALCIUM 600 PO) Take 1 tablet by mouth daily.     neomycin-polymyxin b-dexamethasone (MAXITROL) 3.5-10000-0.1 SUSP PLEASE SEE ATTACHED FOR DETAILED DIRECTIONS     omeprazole (PRILOSEC OTC) 20 MG tablet Take 1 tablet  (20 mg total) by mouth as needed. 100 tablet 4   No current facility-administered medications on file prior to visit.    BP (!) 148/80    Pulse 76    Temp 98.7 F (37.1 C) (Oral)    Ht 5\' 4"  (1.626 m)    Wt 141 lb (64 kg)    SpO2 98%    BMI 24.20 kg/m        Objective:   Physical Exam Vitals and nursing note reviewed.  Constitutional:      General: She is not in acute distress.    Appearance: Normal appearance. She is well-developed. She is not ill-appearing.  HENT:     Head: Normocephalic and atraumatic.     Right Ear: Tympanic membrane, ear canal and external ear normal. There is no impacted cerumen.     Left Ear: Tympanic membrane, ear canal and external ear normal. There is no impacted cerumen.     Nose: Nose normal. No congestion or rhinorrhea.     Mouth/Throat:     Mouth: Mucous membranes are moist.     Pharynx: Oropharynx is clear. No oropharyngeal exudate or posterior oropharyngeal erythema.  Eyes:     General:        Right eye: No discharge.        Left eye: No discharge.     Extraocular Movements: Extraocular movements intact.     Conjunctiva/sclera: Conjunctivae normal.     Pupils: Pupils are equal, round, and reactive to light.  Neck:     Thyroid: No thyromegaly.     Vascular: No carotid bruit.     Trachea: No tracheal deviation.  Cardiovascular:     Rate and Rhythm: Normal rate and regular rhythm.     Pulses: Normal pulses.     Heart sounds: Normal heart sounds. No murmur heard.   No friction rub. No gallop.  Pulmonary:     Effort: Pulmonary effort is normal. No respiratory distress.     Breath sounds: Normal breath sounds. No stridor. No wheezing, rhonchi or rales.  Chest:     Chest wall: No tenderness.  Abdominal:     General: Abdomen is flat. Bowel sounds are normal. There is no distension.     Palpations: Abdomen is soft. There is no mass.     Tenderness: There is no abdominal tenderness. There is no right CVA tenderness, left CVA tenderness,  guarding or rebound.     Hernia: No hernia is present.  Musculoskeletal:        General: No swelling, tenderness, deformity or signs of injury. Normal range of motion.     Cervical back: Normal range of motion  and neck supple.     Right lower leg: No edema.     Left lower leg: No edema.  Lymphadenopathy:     Cervical: No cervical adenopathy.  Skin:    General: Skin is warm and dry.     Coloration: Skin is not jaundiced or pale.     Findings: No bruising, erythema, lesion or rash.  Neurological:     General: No focal deficit present.     Mental Status: She is alert and oriented to person, place, and time.     Cranial Nerves: No cranial nerve deficit.     Sensory: No sensory deficit.     Motor: No weakness.     Coordination: Coordination normal.     Gait: Gait normal.     Deep Tendon Reflexes: Reflexes normal.  Psychiatric:        Mood and Affect: Mood normal.        Behavior: Behavior normal.        Thought Content: Thought content normal.        Judgment: Judgment normal.      Assessment & Plan:  1. Other hyperlipidemia - She had labs done prior to her CPE. We reviewed her labs. Cholesterol panel still elevated. Refuses statin. Continue to eat healthy and exercise   2. Gastroesophageal reflux disease without esophagitis - Continue with Prilosec   3. Age-related osteoporosis without current pathological fracture - Did not want repeat steroid injection today  - Follow up as needed  [name], NP

## 2021-03-14 ENCOUNTER — Ambulatory Visit (INDEPENDENT_AMBULATORY_CARE_PROVIDER_SITE_OTHER): Payer: Medicare Other

## 2021-03-14 VITALS — Ht 64.0 in | Wt 142.0 lb

## 2021-03-14 DIAGNOSIS — Z0000 Encounter for general adult medical examination without abnormal findings: Secondary | ICD-10-CM

## 2021-03-14 NOTE — Progress Notes (Addendum)
I connected with Cheryl Franklin today by telephone and verified that I am speaking with the correct person using two identifiers. Location patient: home Location provider: work Persons participating in the virtual visit: Jeniah, Kishi LPN.   I discussed the limitations, risks, security and privacy concerns of performing an evaluation and management service by telephone and the availability of in person appointments. I also discussed with the patient that there may be a patient responsible charge related to this service. The patient expressed understanding and verbally consented to this telephonic visit.    Interactive audio and video telecommunications were attempted between this provider and patient, however failed, due to patient having technical difficulties OR patient did not have access to video capability.  We continued and completed visit with audio only.     Vital signs may be patient reported or missing.  Subjective:   Cheryl Franklin is a 81 y.o. female who presents for Medicare Annual (Subsequent) preventive examination.  Review of Systems     Cardiac Risk Factors include: advanced age (>10men, >60 women)     Objective:    Today's Vitals   03/14/21 1456  Weight: 142 lb (64.4 kg)  Height: 5\' 4"  (1.626 m)   Body mass index is 24.37 kg/m.  Advanced Directives 03/14/2021 06/11/2017 12/15/2013 12/03/2013  Does Patient Have a Medical Advance Directive? Yes No No No  Type of Paramedic of [place];Living will - - -  Copy of [place] in Chart? No - copy requested - - -  Would patient like information on creating a medical advance directive? - - - No - patient declined information    Current Medications (verified) Outpatient Encounter Medications as of 03/14/2021  Medication Sig   Calcium Carbonate (CALCIUM 600 PO) Take 1 tablet by mouth daily.   omeprazole (PRILOSEC OTC) 20 MG tablet Take 1 tablet (20 mg total) by mouth as  needed.   neomycin-polymyxin b-dexamethasone (MAXITROL) 3.5-10000-0.1 SUSP PLEASE SEE ATTACHED FOR DETAILED DIRECTIONS (Patient not taking: Reported on 03/14/2021)   No facility-administered encounter medications on file as of 03/14/2021.    Allergies (verified) Patient has no known allergies.   History: Past Medical History:  Diagnosis Date   Allergy    Baker's cyst    GERD (gastroesophageal reflux disease)    OA (osteoarthritis)    no per pt   Past Surgical History:  Procedure Laterality Date   APPENDECTOMY     COLONOSCOPY     TUBAL LIGATION     Family History  Problem Relation Age of Onset   Diabetes Other    Stroke Other    Heart disease Other    Hypertension Other    Heart disease Mother    Heart disease Father    Diabetes Father    Colon cancer Neg Hx    Esophageal cancer Neg Hx    Stomach cancer Neg Hx    Rectal cancer Neg Hx    Social History   Socioeconomic History   Marital status: Widowed    Spouse name: Not on file   Number of children: Not on file   Years of education: Not on file   Highest education level: Not on file  Occupational History   Not on file  Tobacco Use   Smoking status: Never   Smokeless tobacco: Never  Vaping Use   Vaping Use: Never used  Substance and Sexual Activity   Alcohol use: No   Drug use: No   Sexual  activity: Not on file  Other Topics Concern   Not on file  Social History Narrative   Lives alone in [place] style home with drive in garage downstairs, so she walks up stairs to her living space   Plans to age in place    No falls; no problem with stairs    Has 2 children who live near    3 grandchildren live near;    2 grands live away; one in  [place]; one going to  [employer] this summer   One going to [employer] in  [place] Resource Strain: Low Risk    Difficulty of Paying Living Expenses: Not hard at all  Food Insecurity: No Food Insecurity   Worried About  Charity fundraiser in the Last Year: Never true   Arboriculturist in the Last Year: Never true  Transportation Needs: No Transportation Needs   Lack of Transportation (Medical): No   Lack of Transportation (Non-Medical): No  Physical Activity: Sufficiently Active   Days of Exercise per Week: 4 days   Minutes of Exercise per Session: 60 min  Stress: No Stress Concern Present   Feeling of Stress : Not at all  Social Connections: Not on file    Tobacco Counseling Counseling given: Not Answered   Clinical Intake:  Pre-visit preparation completed: Yes  Pain : No/denies pain     Nutritional Status: BMI of 19-24  Normal Nutritional Risks: None Diabetes: No  How often do you need to have someone help you when you read instructions, pamphlets, or other written materials from your doctor or pharmacy?: 1 - Never  Diabetic? no  Interpreter Needed?: No  Information entered by :: NAllen LPN   Activities of Daily Living In your present state of health, do you have any difficulty performing the following activities: 03/14/2021 01/13/2021  Hearing? Tempie Donning  Vision? N N  Difficulty concentrating or making decisions? N N  Walking or climbing stairs? N N  Dressing or bathing? N N  Doing errands, shopping? N N  Preparing Food and eating ? N -  Using the Toilet? N -  In the past six months, have you accidently leaked urine? Y -  Do you have problems with loss of bowel control? N -  Managing your Medications? N -  Managing your Finances? N -  Housekeeping or managing your Housekeeping? N -  Some recent data might be hidden    Patient Care Team: [name], NP as PCP - General (Family Medicine)  Indicate any recent Medical Services you may have received from other than [facility] providers in the past year (date may be approximate).     Assessment:   This is a routine wellness examination for Cheryl Franklin.  Hearing/Vision screen Vision Screening - Comments:: Regular eye exams, in  [place]  Dietary issues and exercise activities discussed: Current Exercise Habits: Home exercise routine, Type of exercise: walking, Time (Minutes): 60, Frequency (Times/Week): 4, Weekly Exercise (Minutes/Week): 240   Goals Addressed             This Visit's Progress    Patient Stated       03/14/2021, no goals       Depression Screen PHQ 2/9 Scores 03/14/2021 01/13/2021 06/11/2017 06/07/2016 03/01/2015 12/08/2013  PHQ - 2 Score 0 0 0 0 0 0    Fall Risk Fall Risk  03/14/2021 01/13/2021 01/12/2020 12/24/2018 08/28/2018  Falls in the past  year? 0 1 1 0 (No Data)  Comment - - - Emmi Telephone Survey: data to providers prior to load Emmi Telephone Survey: data to providers prior to load  Number falls in past yr: - 0 1 - (No Data)  Comment - - - - Emmi Telephone Survey Actual Response =   Injury with Fall? - 1 1 - -  Comment - - injured left side - -  Risk for fall due to : No Fall Risks - - - -  Follow up Falls evaluation completed;Education provided;Falls prevention discussed - - - -  Comment - - - - -    FALL RISK PREVENTION PERTAINING TO THE HOME:  Any stairs in or around the home? Yes  If so, are there any without handrails? No  Home free of loose throw rugs in walkways, pet beds, electrical cords, etc? Yes  Adequate lighting in your home to reduce risk of falls? Yes   ASSISTIVE DEVICES UTILIZED TO PREVENT FALLS:  Life alert? No  Use of a cane, walker or w/c? No  Grab bars in the bathroom? No  Shower chair or bench in shower? No  Elevated toilet seat or a handicapped toilet? Yes   TIMED UP AND GO:  Was the test performed? No .      Cognitive Function: MMSE - Mini Mental State Exam 06/11/2017 06/07/2016  Not completed: (No Data) (No Data)        Immunizations Immunization History  Administered Date(s) Administered   Influenza Whole 01/29/2005, 01/29/2006, 10/29/2008   Influenza, High Dose Seasonal PF 12/08/2013   Influenza,inj,quad, With Preservative  11/20/2016   Influenza-Unspecified 12/29/2014, 10/29/2016, 11/26/2017   Moderna Sars-Covid-2 Vaccination 03/13/2019, 04/08/2019, 12/16/2019   Pneumococcal Conjugate-13 02/01/2014   Pneumococcal Polysaccharide-23 01/07/2007   Td 01/29/2005, 06/12/2016    TDAP status: Up to date  Flu Vaccine status: Up to date  Pneumococcal vaccine status: Up to date  Covid-19 vaccine status: Completed vaccines  Qualifies for Shingles Vaccine? Yes   Zostavax completed No   Shingrix Completed?: No.    Education has been provided regarding the importance of this vaccine. Patient has been advised to call insurance company to determine out of pocket expense if they have not yet received this vaccine. Advised may also receive vaccine at local pharmacy or Health Dept. Verbalized acceptance and understanding.  Screening Tests Health Maintenance  Topic Date Due   Hepatitis C Screening  Never done   INFLUENZA VACCINE  08/29/2020   COVID-19 Vaccine (4 - Booster for Moderna series) 03/30/2021 (Originally 02/10/2020)   Zoster Vaccines- Shingrix (1 of 2) 06/11/2021 (Originally 04/30/1959)   TETANUS/TDAP  06/13/2026   Pneumonia Vaccine 43+ Years old  Completed   DEXA SCAN  Completed   HPV VACCINES  Aged Out    Health Maintenance  Health Maintenance Due  Topic Date Due   Hepatitis C Screening  Never done   INFLUENZA VACCINE  08/29/2020    Colorectal cancer screening: No longer required.   Mammogram status: No longer required due to age.  Bone Density status: Completed 08/15/2012.  Lung Cancer Screening: (Low Dose CT Chest recommended if Age 68-80 years, 30 pack-year currently smoking OR have quit w/in 15years.) does not qualify.   Lung Cancer Screening Referral:  no  Additional Screening:  Hepatitis C Screening: does qualify;   Vision Screening: Recommended annual ophthalmology exams for early detection of glaucoma and other disorders of the eye. Is the patient up to date with their annual eye  exam?  Yes  Who is the provider or what is the name of the office in which the patient attends annual eye exams? In [place] If pt is not established with a provider, would they like to be referred to a provider to establish care? No .   Dental Screening: Recommended annual dental exams for proper oral hygiene  Community Resource Referral / Chronic Care Management: CRR required this visit?  No   CCM required this visit?  No      Plan:     I have personally reviewed and noted the following in the patients chart:   Medical and social history Use of alcohol, tobacco or illicit drugs  Current medications and supplements including opioid prescriptions.  Functional ability and status Nutritional status Physical activity Advanced directives List of other physicians Hospitalizations, surgeries, and ER visits in previous 12 months Vitals Screenings to include cognitive, depression, and falls Referrals and appointments  In addition, I have reviewed and discussed with patient certain preventive protocols, quality metrics, and best practice recommendations. A written personalized care plan for preventive services as well as general preventive health recommendations were provided to patient.     Kellie Simmering, LPN   7/51/7001   Nurse Notes: 6 CIT not administered. Patient appeared to have normal cognition per conversation.  Due to this being a virtual visit, the after visit summary with patients personalized plan was offered to patient via mail or my-chart. Patient declined at this time.

## 2021-03-14 NOTE — Patient Instructions (Signed)
Ms. Cheryl Franklin , Thank you for taking time to come for your Medicare Wellness Visit. I appreciate your ongoing commitment to your health goals. Please review the following plan we discussed and let me know if I can assist you in the future.   Screening recommendations/referrals: Colonoscopy: not required Mammogram: not required Bone Density: completed 08/15/2012 Recommended yearly ophthalmology/optometry visit for glaucoma screening and checkup Recommended yearly dental visit for hygiene and checkup  Vaccinations: Influenza vaccine: completed per patient Pneumococcal vaccine: completed 02/01/2014 Tdap vaccine: completed 06/12/2016, due 06/13/2026 Shingles vaccine: decline   Covid-19: 12/16/2019, 04/08/2019, 03/13/2019  Advanced directives: Please bring a copy of your POA (Power of Attorney) and/or Living Will to your next appointment.   Conditions/risks identified: none  Next appointment: Follow up in one year for your annual wellness visit    Preventive Care 65 Years and Older, Female Preventive care refers to lifestyle choices and visits with your health care provider that can promote health and wellness. What does preventive care include? A yearly physical exam. This is also called an annual well check. Dental exams once or twice a year. Routine eye exams. Ask your health care provider how often you should have your eyes checked. Personal lifestyle choices, including: Daily care of your teeth and gums. Regular physical activity. Eating a healthy diet. Avoiding tobacco and drug use. Limiting alcohol use. Practicing safe sex. Taking low-dose aspirin every day. Taking vitamin and mineral supplements as recommended by your health care provider. What happens during an annual well check? The services and screenings done by your health care provider during your annual well check will depend on your age, overall health, lifestyle risk factors, and family history of disease. Counseling  Your  health care provider may ask you questions about your: Alcohol use. Tobacco use. Drug use. Emotional well-being. Home and relationship well-being. Sexual activity. Eating habits. History of falls. Memory and ability to understand (cognition). Work and work Statistician. Reproductive health. Screening  You may have the following tests or measurements: Height, weight, and BMI. Blood pressure. Lipid and cholesterol levels. These may be checked every 5 years, or more frequently if you are over 17 years old. Skin check. Lung cancer screening. You may have this screening every year starting at age 55 if you have a 30-pack-year history of smoking and currently smoke or have quit within the past 15 years. Fecal occult blood test (FOBT) of the stool. You may have this test every year starting at age 28. Flexible sigmoidoscopy or colonoscopy. You may have a sigmoidoscopy every 5 years or a colonoscopy every 10 years starting at age 52. Hepatitis C blood test. Hepatitis B blood test. Sexually transmitted disease (STD) testing. Diabetes screening. This is done by checking your blood sugar (glucose) after you have not eaten for a while (fasting). You may have this done every 1-3 years. Bone density scan. This is done to screen for osteoporosis. You may have this done starting at age 20. Mammogram. This may be done every 1-2 years. Talk to your health care provider about how often you should have regular mammograms. Talk with your health care provider about your test results, treatment options, and if necessary, the need for more tests. Vaccines  Your health care provider may recommend certain vaccines, such as: Influenza vaccine. This is recommended every year. Tetanus, diphtheria, and acellular pertussis (Tdap, Td) vaccine. You may need a Td booster every 10 years. Zoster vaccine. You may need this after age 68. Pneumococcal 13-valent conjugate (PCV13) vaccine. One  dose is recommended after age  23. Pneumococcal polysaccharide (PPSV23) vaccine. One dose is recommended after age 64. Talk to your health care provider about which screenings and vaccines you need and how often you need them. This information is not intended to replace advice given to you by your health care provider. Make sure you discuss any questions you have with your health care provider. Document Released: 02/11/2015 Document Revised: 10/05/2015 Document Reviewed: 11/16/2014 Elsevier Interactive Patient Education  2017 [employer] Prevention in the Home Falls can cause injuries. They can happen to people of all ages. There are many things you can do to make your home safe and to help prevent falls. What can I do on the outside of my home? Regularly fix the edges of walkways and driveways and fix any cracks. Remove anything that might make you trip as you walk through a door, such as a raised step or threshold. Trim any bushes or trees on the path to your home. Use bright outdoor lighting. Clear any walking paths of anything that might make someone trip, such as rocks or tools. Regularly check to see if handrails are loose or broken. Make sure that both sides of any steps have handrails. Any raised decks and porches should have guardrails on the edges. Have any leaves, snow, or ice cleared regularly. Use sand or salt on walking paths during winter. Clean up any spills in your garage right away. This includes oil or grease spills. What can I do in the bathroom? Use night lights. Install grab bars by the toilet and in the tub and shower. Do not use towel bars as grab bars. Use non-skid mats or decals in the tub or shower. If you need to sit down in the shower, use a plastic, non-slip stool. Keep the floor dry. Clean up any water that spills on the floor as soon as it happens. Remove soap buildup in the tub or shower regularly. Attach bath mats securely with double-sided non-slip rug tape. Do not have throw  rugs and other things on the floor that can make you trip. What can I do in the bedroom? Use night lights. Make sure that you have a light by your bed that is easy to reach. Do not use any sheets or blankets that are too big for your bed. They should not hang down onto the floor. Have a firm chair that has side arms. You can use this for support while you get dressed. Do not have throw rugs and other things on the floor that can make you trip. What can I do in the kitchen? Clean up any spills right away. Avoid walking on wet floors. Keep items that you use a lot in easy-to-reach places. If you need to reach something above you, use a strong step stool that has a grab bar. Keep electrical cords out of the way. Do not use floor polish or wax that makes floors slippery. If you must use wax, use non-skid floor wax. Do not have throw rugs and other things on the floor that can make you trip. What can I do with my stairs? Do not leave any items on the stairs. Make sure that there are handrails on both sides of the stairs and use them. Fix handrails that are broken or loose. Make sure that handrails are as long as the stairways. Check any carpeting to make sure that it is firmly attached to the stairs. Fix any carpet that is loose or worn.  Avoid having throw rugs at the top or bottom of the stairs. If you do have throw rugs, attach them to the floor with carpet tape. Make sure that you have a light switch at the top of the stairs and the bottom of the stairs. If you do not have them, ask someone to add them for you. What else can I do to help prevent falls? Wear shoes that: Do not have high heels. Have rubber bottoms. Are comfortable and fit you well. Are closed at the toe. Do not wear sandals. If you use a stepladder: Make sure that it is fully opened. Do not climb a closed stepladder. Make sure that both sides of the stepladder are locked into place. Ask someone to hold it for you, if  possible. Clearly mark and make sure that you can see: Any grab bars or handrails. First and last steps. Where the edge of each step is. Use tools that help you move around (mobility aids) if they are needed. These include: Canes. Walkers. Scooters. Crutches. Turn on the lights when you go into a dark area. Replace any light bulbs as soon as they burn out. Set up your furniture so you have a clear path. Avoid moving your furniture around. If any of your floors are uneven, fix them. If there are any pets around you, be aware of where they are. Review your medicines with your doctor. Some medicines can make you feel dizzy. This can increase your chance of falling. Ask your doctor what other things that you can do to help prevent falls. This information is not intended to replace advice given to you by your health care provider. Make sure you discuss any questions you have with your health care provider. Document Released: 11/11/2008 Document Revised: 06/23/2015 Document Reviewed: 02/19/2014 Elsevier Interactive Patient Education  2017 [employer].

## 2021-04-29 DIAGNOSIS — J309 Allergic rhinitis, unspecified: Secondary | ICD-10-CM | POA: Diagnosis not present

## 2021-06-07 DIAGNOSIS — H26493 Other secondary cataract, bilateral: Secondary | ICD-10-CM | POA: Diagnosis not present

## 2021-06-07 DIAGNOSIS — Z961 Presence of intraocular lens: Secondary | ICD-10-CM | POA: Diagnosis not present

## 2021-06-07 DIAGNOSIS — H43393 Other vitreous opacities, bilateral: Secondary | ICD-10-CM | POA: Diagnosis not present

## 2021-06-07 DIAGNOSIS — H31113 Age-related choroidal atrophy, bilateral: Secondary | ICD-10-CM | POA: Diagnosis not present

## 2021-06-07 DIAGNOSIS — H04203 Unspecified epiphora, bilateral lacrimal glands: Secondary | ICD-10-CM | POA: Diagnosis not present

## 2021-07-20 DIAGNOSIS — H04553 Acquired stenosis of bilateral nasolacrimal duct: Secondary | ICD-10-CM | POA: Diagnosis not present

## 2021-07-20 DIAGNOSIS — H04563 Stenosis of bilateral lacrimal punctum: Secondary | ICD-10-CM | POA: Diagnosis not present

## 2021-07-20 DIAGNOSIS — H04203 Unspecified epiphora, bilateral lacrimal glands: Secondary | ICD-10-CM | POA: Diagnosis not present

## 2021-07-20 DIAGNOSIS — H04543 Stenosis of bilateral lacrimal canaliculi: Secondary | ICD-10-CM | POA: Diagnosis not present

## 2021-09-06 DIAGNOSIS — Z01818 Encounter for other preprocedural examination: Secondary | ICD-10-CM | POA: Diagnosis not present

## 2021-09-06 DIAGNOSIS — H04203 Unspecified epiphora, bilateral lacrimal glands: Secondary | ICD-10-CM | POA: Diagnosis not present

## 2021-09-06 DIAGNOSIS — H04553 Acquired stenosis of bilateral nasolacrimal duct: Secondary | ICD-10-CM | POA: Diagnosis not present

## 2021-09-06 DIAGNOSIS — H04563 Stenosis of bilateral lacrimal punctum: Secondary | ICD-10-CM | POA: Diagnosis not present

## 2021-09-20 DIAGNOSIS — H04563 Stenosis of bilateral lacrimal punctum: Secondary | ICD-10-CM | POA: Diagnosis not present

## 2021-09-20 DIAGNOSIS — H04542 Stenosis of left lacrimal canaliculi: Secondary | ICD-10-CM | POA: Diagnosis not present

## 2021-09-20 DIAGNOSIS — H04561 Stenosis of right lacrimal punctum: Secondary | ICD-10-CM | POA: Diagnosis not present

## 2021-09-20 DIAGNOSIS — H04562 Stenosis of left lacrimal punctum: Secondary | ICD-10-CM | POA: Diagnosis not present

## 2021-09-20 DIAGNOSIS — H04541 Stenosis of right lacrimal canaliculi: Secondary | ICD-10-CM | POA: Diagnosis not present

## 2021-09-20 DIAGNOSIS — H04543 Stenosis of bilateral lacrimal canaliculi: Secondary | ICD-10-CM | POA: Diagnosis not present

## 2021-10-25 ENCOUNTER — Ambulatory Visit (INDEPENDENT_AMBULATORY_CARE_PROVIDER_SITE_OTHER): Payer: Medicare Other | Admitting: Orthopaedic Surgery

## 2021-10-25 ENCOUNTER — Ambulatory Visit: Payer: Self-pay

## 2021-10-25 ENCOUNTER — Encounter: Payer: Self-pay | Admitting: Orthopaedic Surgery

## 2021-10-25 ENCOUNTER — Ambulatory Visit (INDEPENDENT_AMBULATORY_CARE_PROVIDER_SITE_OTHER): Payer: Medicare Other

## 2021-10-25 DIAGNOSIS — M25562 Pain in left knee: Secondary | ICD-10-CM

## 2021-10-25 DIAGNOSIS — M1712 Unilateral primary osteoarthritis, left knee: Secondary | ICD-10-CM

## 2021-10-25 DIAGNOSIS — G8929 Other chronic pain: Secondary | ICD-10-CM

## 2021-10-25 DIAGNOSIS — M25561 Pain in right knee: Secondary | ICD-10-CM

## 2021-10-25 MED ORDER — LIDOCAINE HCL 1 % IJ SOLN
3.0000 mL | INTRAMUSCULAR | Status: AC | PRN
  Administered 2021-10-25: 3 mL

## 2021-10-25 MED ORDER — METHYLPREDNISOLONE ACETATE 40 MG/ML IJ SUSP
40.0000 mg | INTRAMUSCULAR | Status: AC | PRN
  Administered 2021-10-25: 40 mg via INTRA_ARTICULAR

## 2021-10-25 NOTE — Progress Notes (Signed)
Office Visit Note   Patient: Cheryl Franklin           Date of Birth: 11-Jun-1940           MRN: [id number] Visit Date: 10/25/2021              Requested by: [name], NP [place],  [place] [address] PCP: [name], NP   Assessment & Plan: Visit Diagnoses:  1. Chronic pain of left knee   2. Acute pain of right knee   3. Unilateral primary osteoarthritis, left knee     Plan: The patient did request steroid injections in both knees today and I agree with this.  I did explain that she has severe arthritis of her left knee and gets the point where none of these conservative treatment measures are working that we could consider knee replacement.  She does go to on exercise walks at her gym and I am fine with her still doing that and working on quad strengthening exercises.  She did tolerate steroid injections in both her knees today.  All questions and concerns were answered and addressed.  Follow-up is as needed.  Follow-Up Instructions: Return if symptoms worsen or fail to improve.   Orders:  Orders Placed This Encounter  Procedures   Large Joint Inj   Large Joint Inj   XR Knee 1-2 Views Left   XR Knee 1-2 Views Right   No orders of the defined types were placed in this encounter.     Procedures: Large Joint Inj: L knee on 10/25/2021 1:56 PM Indications: diagnostic evaluation and pain Details: 22 G 1.5 in needle, superolateral approach  Arthrogram: No  Medications: 3 mL lidocaine 1 %; 40 mg methylPREDNISolone acetate 40 MG/ML Outcome: tolerated well, no immediate complications Procedure, treatment alternatives, risks and benefits explained, specific risks discussed. Consent was given by the patient. Immediately prior to procedure a time out was called to verify the correct patient, procedure, equipment, support staff and site/side marked as required. Patient was prepped and draped in the usual sterile fashion.    Large Joint Inj: R knee on 10/25/2021  1:56 PM Indications: diagnostic evaluation and pain Details: 22 G 1.5 in needle, superolateral approach  Arthrogram: No  Medications: 3 mL lidocaine 1 %; 40 mg methylPREDNISolone acetate 40 MG/ML Outcome: tolerated well, no immediate complications Procedure, treatment alternatives, risks and benefits explained, specific risks discussed. Consent was given by the patient. Immediately prior to procedure a time out was called to verify the correct patient, procedure, equipment, support staff and site/side marked as required. Patient was prepped and draped in the usual sterile fashion.       Clinical Data: No additional findings.   Subjective: Chief Complaint  Patient presents with   Right Knee - Pain   Left Knee - Pain  The patient is an 81 year old female who has been dealing with left knee pain for a long period of time.  Her right knee is really started to hurt her just recently.  She has seen my partner Dr. Durward Fortes in the past and is well-established and documented osteoarthritis of her left knee.  Steroid injections have helped in the past.  There has been no known injury but her knee started hurting about 3 weeks ago.  She does have a history of a Baker's cyst as well.  She denies groin pain or numbness and tingling.  She would like to consider steroid injections today in her knees.  She is  not a diabetic.  HPI  Review of Systems There is no fever, chills, nausea, vomiting  Objective: Vital Signs: There were no vitals taken for this visit.  Physical Exam She is alert and orient x3 and in no acute distress.  She does not walk with an assistive device. Ortho Exam Examination of her left knee shows valgus malalignment of that knee.  There is a mild effusion and painful arc of motion of the knee with patellofemoral crepitation.  Her right knee has neutral alignment with no effusion and good range of motion with less crepitation and less pain. Specialty Comments:  No specialty  comments available.  Imaging: XR Knee 1-2 Views Left  Result Date: 10/25/2021 2 views of left knee show tricompartment arthritis with valgus malalignment.  There is severe loss of space at the patellofemoral joint and the lateral compartment of the knee.  There are osteophytes in all 3 compartments.  XR Knee 1-2 Views Right  Result Date: 10/25/2021 2 views of the right knee showed no acute findings.  There is mild to moderate arthritic changes.    PMFS History: Patient Active Problem List   Diagnosis Date Noted   Unilateral primary osteoarthritis, left knee 05/17/2020   Routine general medical examination at a health care facility 03/01/2015   Actinic keratoses 05/08/2011   SEBORRHEIC KERATOSIS, INFLAMED 06/09/2009   Osteoporosis 01/07/2007   ALLERGIC RHINITIS 10/07/2006   GERD 10/07/2006   Past Medical History:  Diagnosis Date   Allergy    Baker's cyst    GERD (gastroesophageal reflux disease)    OA (osteoarthritis)    no per pt    Family History  Problem Relation Age of Onset   Diabetes Other    Stroke Other    Heart disease Other    Hypertension Other    Heart disease Mother    Heart disease Father    Diabetes Father    Colon cancer Neg Hx    Esophageal cancer Neg Hx    Stomach cancer Neg Hx    Rectal cancer Neg Hx     Past Surgical History:  Procedure Laterality Date   APPENDECTOMY     COLONOSCOPY     TUBAL LIGATION     Social History   Occupational History   Not on file  Tobacco Use   Smoking status: Never   Smokeless tobacco: Never  Vaping Use   Vaping Use: Never used  Substance and Sexual Activity   Alcohol use: No   Drug use: No   Sexual activity: Not on file

## 2022-01-17 DIAGNOSIS — R051 Acute cough: Secondary | ICD-10-CM | POA: Diagnosis not present

## 2022-01-17 DIAGNOSIS — R509 Fever, unspecified: Secondary | ICD-10-CM | POA: Diagnosis not present

## 2022-01-17 DIAGNOSIS — J111 Influenza due to unidentified influenza virus with other respiratory manifestations: Secondary | ICD-10-CM | POA: Diagnosis not present

## 2022-02-01 DIAGNOSIS — J209 Acute bronchitis, unspecified: Secondary | ICD-10-CM | POA: Diagnosis not present

## 2022-02-01 DIAGNOSIS — J029 Acute pharyngitis, unspecified: Secondary | ICD-10-CM | POA: Diagnosis not present

## 2022-02-01 DIAGNOSIS — J019 Acute sinusitis, unspecified: Secondary | ICD-10-CM | POA: Diagnosis not present

## 2022-02-06 ENCOUNTER — Other Ambulatory Visit (INDEPENDENT_AMBULATORY_CARE_PROVIDER_SITE_OTHER): Payer: Medicare Other

## 2022-02-06 ENCOUNTER — Telehealth: Payer: Self-pay

## 2022-02-06 DIAGNOSIS — K219 Gastro-esophageal reflux disease without esophagitis: Secondary | ICD-10-CM | POA: Diagnosis not present

## 2022-02-06 DIAGNOSIS — Z0000 Encounter for general adult medical examination without abnormal findings: Secondary | ICD-10-CM

## 2022-02-06 DIAGNOSIS — E039 Hypothyroidism, unspecified: Secondary | ICD-10-CM

## 2022-02-06 DIAGNOSIS — E7849 Other hyperlipidemia: Secondary | ICD-10-CM | POA: Diagnosis not present

## 2022-02-06 LAB — COMPREHENSIVE METABOLIC PANEL
ALT: 11 U/L (ref 0–35)
AST: 17 U/L (ref 0–37)
Albumin: 4.2 g/dL (ref 3.5–5.2)
Alkaline Phosphatase: 67 U/L (ref 39–117)
BUN: 12 mg/dL (ref 6–23)
CO2: 29 mEq/L (ref 19–32)
Calcium: 9.4 mg/dL (ref 8.4–10.5)
Chloride: 102 mEq/L (ref 96–112)
Creatinine, Ser: 0.67 mg/dL (ref 0.40–1.20)
GFR: 82.35 mL/min (ref >60.00)
Glucose, Bld: 86 mg/dL (ref 70–99)
Potassium: 4.2 mEq/L (ref 3.5–5.1)
Sodium: 139 mEq/L (ref 135–145)
Total Bilirubin: 0.4 mg/dL (ref 0.2–1.2)
Total Protein: 7.4 g/dL (ref 6.0–8.3)

## 2022-02-06 LAB — CBC WITH DIFFERENTIAL/PLATELET
Basophils Absolute: 0 10*3/uL (ref 0.0–0.1)
Basophils Relative: 0.8 % (ref 0.0–3.0)
Eosinophils Absolute: 0.1 10*3/uL (ref 0.0–0.7)
Eosinophils Relative: 2.2 % (ref 0.0–5.0)
HCT: 39.3 % (ref 36.0–46.0)
Hemoglobin: 13 g/dL (ref 12.0–15.0)
Lymphocytes Relative: 30.4 % (ref 12.0–46.0)
Lymphs Abs: 1.9 10*3/uL (ref 0.7–4.0)
MCHC: 33 g/dL (ref 30.0–36.0)
MCV: 94.7 fl (ref 78.0–100.0)
Monocytes Absolute: 0.4 10*3/uL (ref 0.1–1.0)
Monocytes Relative: 7.3 % (ref 3.0–12.0)
Neutro Abs: 3.6 10*3/uL (ref 1.4–7.7)
Neutrophils Relative %: 59.3 % (ref 43.0–77.0)
Platelets: 379 10*3/uL (ref 150.0–400.0)
RBC: 4.15 Mil/uL (ref 3.87–5.11)
RDW: 14.2 % (ref 11.5–15.5)
WBC: 6.1 10*3/uL (ref 4.0–10.5)

## 2022-02-06 LAB — LIPID PANEL
Cholesterol: 187 mg/dL (ref 0–200)
HDL: 53.3 mg/dL (ref >39.00)
LDL Cholesterol: 110 mg/dL — ABNORMAL HIGH (ref 0–99)
NonHDL: 133.61
Total CHOL/HDL Ratio: 4
Triglycerides: 118 mg/dL (ref 0.0–149.0)
VLDL: 23.6 mg/dL (ref 0.0–40.0)

## 2022-02-06 LAB — TSH: TSH: 2.46 u[IU]/mL (ref 0.35–5.50)

## 2022-02-06 NOTE — Telephone Encounter (Signed)
Pt scheduled a lab visit a year ago for future labs for physical for 2024. Pt came to the appt. Today but was advised that insurance may not pay for the labs before her visit. Pt stated that she always have labs before her physical and [employer] and [employer] PPO always covers it. Pt advised that if this is not covered she may be charged. Pt stated she is okay with that bc she knows that it will be covered. Lab orders placed. No further action needed.

## 2022-02-13 ENCOUNTER — Encounter: Payer: Medicare Other | Admitting: Adult Health

## 2022-02-22 ENCOUNTER — Encounter: Payer: Self-pay | Admitting: Adult Health

## 2022-02-22 ENCOUNTER — Ambulatory Visit (INDEPENDENT_AMBULATORY_CARE_PROVIDER_SITE_OTHER): Payer: Medicare Other | Admitting: Adult Health

## 2022-02-22 VITALS — BP 128/70 | HR 70 | Temp 97.7°F | Ht 63.0 in | Wt 142.0 lb

## 2022-02-22 DIAGNOSIS — M170 Bilateral primary osteoarthritis of knee: Secondary | ICD-10-CM | POA: Diagnosis not present

## 2022-02-22 DIAGNOSIS — K219 Gastro-esophageal reflux disease without esophagitis: Secondary | ICD-10-CM

## 2022-02-22 DIAGNOSIS — E2839 Other primary ovarian failure: Secondary | ICD-10-CM

## 2022-02-22 DIAGNOSIS — E7849 Other hyperlipidemia: Secondary | ICD-10-CM | POA: Diagnosis not present

## 2022-02-22 NOTE — Patient Instructions (Addendum)
It was great seeing you today   We will follow up with you regarding your lab work   Please let me know if you need anything   Schedule your bone density test at check out desk. You may also call directly to X-ray at [phone] to schedule an appointment that is convenient for you.  - located [address] across the street from [place] - in the basement - you do need an appointment for the bone density tests.  

## 2022-02-22 NOTE — Progress Notes (Signed)
Subjective:    Patient ID: Cheryl Franklin, female    DOB: 08-Dec-1940, 82 y.o.   MRN: [id number]  HPI Patient presents for yearly preventative medicine examination. She is a pleasant 82 year old female who  has a past medical history of Allergy, Baker's cyst, GERD (gastroesophageal reflux disease), and OA (osteoarthritis).  GERD - uses Prilosec PRN   Hyperlipidemia - refuses medications.  Lab Results  Component Value Date   CHOL 187 02/06/2022   HDL 53.30 02/06/2022   LDLCALC 110 (H) 02/06/2022   LDLDIRECT 123.7 04/16/2011   TRIG 118.0 02/06/2022   CHOLHDL 4 02/06/2022   Chronic knee pain - managed by orthopedics. Has steroid injections periodically and reports that the pain  resolves for about 3-4 months. She is going to follow up with Dr. Ninfa Linden.   All immunizations and health maintenance protocols were reviewed with the patient and needed orders were placed. Refuses shingles vaccination   Appropriate screening laboratory values were ordered for the patient including screening of hyperlipidemia, renal function and hepatic function.  Medication reconciliation,  past medical history, social history, problem list and allergies were reviewed in detail with the patient  Goals were established with regard to weight loss, exercise, and  diet in compliance with medications Wt Readings from Last 3 Encounters:  02/22/22 142 lb (64.4 kg)  03/14/21 142 lb (64.4 kg)  01/13/21 141 lb (64 kg)   BP Readings from Last 3 Encounters:  02/22/22 128/70  01/13/21 (!) 148/80  01/12/20 132/80    Review of Systems  Constitutional: Negative.   HENT: Negative.    Eyes: Negative.   Respiratory: Negative.    Cardiovascular: Negative.   Gastrointestinal: Negative.   Endocrine: Negative.   Genitourinary: Negative.   Musculoskeletal:  Positive for arthralgias (chronic), gait problem and neck stiffness (chronic).  Skin: Negative.   Allergic/Immunologic: Negative.   Hematological: Negative.    Psychiatric/Behavioral: Negative.     Past Medical History:  Diagnosis Date   Allergy    Baker's cyst    GERD (gastroesophageal reflux disease)    OA (osteoarthritis)    no per pt    Social History   Socioeconomic History   Marital status: Widowed    Spouse name: Not on file   Number of children: Not on file   Years of education: Not on file   Highest education level: Not on file  Occupational History   Not on file  Tobacco Use   Smoking status: Never   Smokeless tobacco: Never  Vaping Use   Vaping Use: Never used  Substance and Sexual Activity   Alcohol use: No   Drug use: No   Sexual activity: Not on file  Other Topics Concern   Not on file  Social History Narrative   Lives alone in [place] style home with drive in garage downstairs, so she walks up stairs to her living space   Plans to age in place    No falls; no problem with stairs    Has 2 children who live near    3 grandchildren live near;    2 grands live away; one in  [place]; one going to  [employer] this summer   One going to [employer] in  [place] Strain: Low Risk  (03/14/2021)   Overall Financial Resource Strain (CARDIA)    Difficulty of Paying Living Expenses: Not hard at all  Food Insecurity: No Food Insecurity (03/14/2021)  Hunger Vital Sign    Worried About Running Out of Food in the Last Year: Never true    Ran Out of Food in the Last Year: Never true  Transportation Needs: No Transportation Needs (03/14/2021)   PRAPARE - Hydrologist (Medical): No    Lack of Transportation (Non-Medical): No  Physical Activity: Sufficiently Active (03/14/2021)   Exercise Vital Sign    Days of Exercise per Week: 4 days    Minutes of Exercise per Session: 60 min  Stress: No Stress Concern Present (03/14/2021)   [place]    Feeling of Stress : Not at all   Social Connections: Not on file  Intimate Partner Violence: Not on file    Past Surgical History:  Procedure Laterality Date   APPENDECTOMY     COLONOSCOPY     TUBAL LIGATION      Family History  Problem Relation Age of Onset   Diabetes Other    Stroke Other    Heart disease Other    Hypertension Other    Heart disease Mother    Heart disease Father    Diabetes Father    Colon cancer Neg Hx    Esophageal cancer Neg Hx    Stomach cancer Neg Hx    Rectal cancer Neg Hx     No Known Allergies  Current Outpatient Medications on File Prior to Visit  Medication Sig Dispense Refill   Calcium Carbonate (CALCIUM 600 PO) Take 1 tablet by mouth daily.     omeprazole (PRILOSEC OTC) 20 MG tablet Take 1 tablet (20 mg total) by mouth as needed. 100 tablet 4   No current facility-administered medications on file prior to visit.    BP 128/70   Pulse 70   Temp 97.7 F (36.5 C) (Oral)   Ht '5\' 3"'$  (1.6 m)   Wt 142 lb (64.4 kg)   SpO2 97%   BMI 25.15 kg/m       Objective:   Physical Exam Vitals and nursing note reviewed.  Constitutional:      General: She is not in acute distress.    Appearance: Normal appearance. She is well-developed. She is not ill-appearing.  HENT:     Head: Normocephalic and atraumatic.     Right Ear: Tympanic membrane, ear canal and external ear normal. There is no impacted cerumen.     Left Ear: Tympanic membrane, ear canal and external ear normal. There is no impacted cerumen.     Nose: Nose normal. No congestion or rhinorrhea.     Mouth/Throat:     Mouth: Mucous membranes are moist.     Pharynx: Oropharynx is clear. No oropharyngeal exudate or posterior oropharyngeal erythema.  Eyes:     General:        Right eye: No discharge.        Left eye: No discharge.     Extraocular Movements: Extraocular movements intact.     Conjunctiva/sclera: Conjunctivae normal.     Pupils: Pupils are equal, round, and reactive to light.  Neck:     Thyroid: No  thyromegaly.     Vascular: No carotid bruit.     Trachea: No tracheal deviation.  Cardiovascular:     Rate and Rhythm: Normal rate and regular rhythm.     Pulses: Normal pulses.     Heart sounds: Normal heart sounds. No murmur heard.    No friction rub. No gallop.  Pulmonary:  Effort: Pulmonary effort is normal. No respiratory distress.     Breath sounds: Normal breath sounds. No stridor. No wheezing, rhonchi or rales.  Chest:     Chest wall: No tenderness.  Abdominal:     General: Abdomen is flat. Bowel sounds are normal. There is no distension.     Palpations: Abdomen is soft. There is no mass.     Tenderness: There is no abdominal tenderness. There is no right CVA tenderness, left CVA tenderness, guarding or rebound.     Hernia: No hernia is present.  Musculoskeletal:        General: No swelling, tenderness, deformity or signs of injury. Normal range of motion.     Cervical back: Normal range of motion and neck supple.     Right lower leg: No edema.     Left lower leg: No edema.  Lymphadenopathy:     Cervical: No cervical adenopathy.  Skin:    General: Skin is warm and dry.     Coloration: Skin is not jaundiced or pale.     Findings: No bruising, erythema, lesion or rash.  Neurological:     General: No focal deficit present.     Mental Status: She is alert and oriented to person, place, and time.     Cranial Nerves: No cranial nerve deficit.     Sensory: No sensory deficit.     Motor: No weakness.     Coordination: Coordination normal.     Gait: Gait normal.     Deep Tendon Reflexes: Reflexes normal.  Psychiatric:        Mood and Affect: Mood normal.        Behavior: Behavior normal.        Thought Content: Thought content normal.        Judgment: Judgment normal.       Assessment & Plan:  1. Other hyperlipidemia - she had labs drawn prior to her exam today. Lipid panel has improved but LDL still above goal. She continues to refuse statins   2. Gastroesophageal  reflux disease without esophagitis - Continue with PPI  3. Primary osteoarthritis of both knees - Per orthopedics   4. Estrogen deficiency  - DG BONE DENSITY (DXA); Future    [name], NP

## 2022-02-23 ENCOUNTER — Telehealth: Payer: Self-pay | Admitting: Adult Health

## 2022-02-23 NOTE — Telephone Encounter (Signed)
Pt is calling and she is sch for her cpe on 02-27-2023 and would like to have her labs in advance

## 2022-02-23 NOTE — Telephone Encounter (Signed)
Pt advised to call back near her CPE time. Pt verbalized understanding.

## 2022-03-07 ENCOUNTER — Ambulatory Visit (INDEPENDENT_AMBULATORY_CARE_PROVIDER_SITE_OTHER): Payer: Medicare Other | Admitting: Orthopaedic Surgery

## 2022-03-07 DIAGNOSIS — M25561 Pain in right knee: Secondary | ICD-10-CM | POA: Diagnosis not present

## 2022-03-07 DIAGNOSIS — M25562 Pain in left knee: Secondary | ICD-10-CM

## 2022-03-07 DIAGNOSIS — G8929 Other chronic pain: Secondary | ICD-10-CM

## 2022-03-07 MED ORDER — METHYLPREDNISOLONE ACETATE 40 MG/ML IJ SUSP
40.0000 mg | INTRAMUSCULAR | Status: AC | PRN
  Administered 2022-03-07: 40 mg via INTRA_ARTICULAR

## 2022-03-07 MED ORDER — LIDOCAINE HCL 1 % IJ SOLN
3.0000 mL | INTRAMUSCULAR | Status: AC | PRN
  Administered 2022-03-07: 3 mL

## 2022-03-07 NOTE — Progress Notes (Signed)
The patient comes in today requesting steroid injections in both her knees.  It has been about 4-1/2 months since she has had injections.  She is an active 82 year old female.  She has known and well-documented osteoarthritis of both knees and is not interested in knee replacement surgery.  She is not diabetic.  She has had no other acute change in her medical status.  Both knees have slight valgus malalignment.  They have patellofemoral cavitation but no instability on ligamentous exam and global tenderness on exam.  There is no significant effusion.  I did place a steroid injection of both knees per her request which she tolerated well.  She knows to wait at least 3 to 4 months between injections.      Procedure Note  Patient: Cheryl Franklin             Date of Birth: 11/22/1940           MRN: [id number]             Visit Date: 03/07/2022  Procedures: Visit Diagnoses:  1. Chronic pain of left knee   2. Chronic pain of right knee     Large Joint Inj: R knee on 03/07/2022 9:41 AM Indications: diagnostic evaluation and pain Details: 22 G 1.5 in needle, superolateral approach  Arthrogram: No  Medications: 3 mL lidocaine 1 %; 40 mg methylPREDNISolone acetate 40 MG/ML Outcome: tolerated well, no immediate complications Procedure, treatment alternatives, risks and benefits explained, specific risks discussed. Consent was given by the patient. Immediately prior to procedure a time out was called to verify the correct patient, procedure, equipment, support staff and site/side marked as required. Patient was prepped and draped in the usual sterile fashion.    Large Joint Inj: L knee on 03/07/2022 9:42 AM Indications: diagnostic evaluation and pain Details: 22 G 1.5 in needle, superolateral approach  Arthrogram: No  Medications: 3 mL lidocaine 1 %; 40 mg methylPREDNISolone acetate 40 MG/ML Outcome: tolerated well, no immediate complications Procedure, treatment alternatives, risks and benefits  explained, specific risks discussed. Consent was given by the patient. Immediately prior to procedure a time out was called to verify the correct patient, procedure, equipment, support staff and site/side marked as required. Patient was prepped and draped in the usual sterile fashion.

## 2022-03-14 DIAGNOSIS — R509 Fever, unspecified: Secondary | ICD-10-CM | POA: Diagnosis not present

## 2022-03-14 DIAGNOSIS — R0981 Nasal congestion: Secondary | ICD-10-CM | POA: Diagnosis not present

## 2022-03-14 DIAGNOSIS — R519 Headache, unspecified: Secondary | ICD-10-CM | POA: Diagnosis not present

## 2022-03-14 DIAGNOSIS — J329 Chronic sinusitis, unspecified: Secondary | ICD-10-CM | POA: Diagnosis not present

## 2022-04-03 ENCOUNTER — Telehealth: Payer: Self-pay | Admitting: Adult Health

## 2022-04-03 NOTE — Telephone Encounter (Signed)
Contacted Cheryl Franklin to schedule their annual wellness visit. Appointment made for 04/10/22.  Cheryl Franklin AWV direct phone # [phone]

## 2022-04-09 ENCOUNTER — Telehealth: Payer: Self-pay | Admitting: Adult Health

## 2022-04-09 NOTE — Telephone Encounter (Signed)
Contacted Cheryl Franklin to schedule their annual wellness visit. Appointment made for 04/12/22.  Barkley Boards AWV direct phone # [phone]   Patient called to r/s her 3/12 appt to 3/14

## 2022-04-10 ENCOUNTER — Telehealth: Payer: Medicare Other | Admitting: Family Medicine

## 2022-04-12 ENCOUNTER — Telehealth (INDEPENDENT_AMBULATORY_CARE_PROVIDER_SITE_OTHER): Payer: Medicare Other | Admitting: Family Medicine

## 2022-04-12 DIAGNOSIS — Z0000 Encounter for general adult medical examination without abnormal findings: Secondary | ICD-10-CM | POA: Diagnosis not present

## 2022-04-12 NOTE — Progress Notes (Signed)
PATIENT CHECK-IN and HEALTH RISK ASSESSMENT QUESTIONNAIRE:  -completed by phone/video for upcoming Medicare Preventive Visit  Pre-Visit Check-in: 1)Vitals (height, wt, BP, etc) - record in vitals section for visit on day of visit 2)Review and Update Medications, Allergies PMH, Surgeries, Social history in Epic 3)Hospitalizations in the last year with date/reason? No   4)Review and Update Care Team (patient's specialists) in Epic 5) Complete PHQ9 in Epic  6) Complete Fall Screening in Epic 7)Review all Health Maintenance Due and order under PCP if not done.  8)Medicare Wellness Questionnaire: Answer theses question about your habits: Do you drink alcohol? No  If yes, how many drinks do you have a day?No  Have you ever smoked?No    Quit date if applicable?  No  How many packs a day do/did you smoke? N/a Do you use smokeless tobacco?No  Do you use an illicit drugs?No  Do you exercises? No - reports is very active and is on her feet a lot.  Are you sexually active? No Number of partners?No  Feels like could eat  Typical breakfast: pop tart, milk Typical lunch: subway, bogangles, arbys Typical dinner: salmon, potato salad or sandwich Typical snacks:small piece of dark chocolate candy, fruit  Beverages: milk, decaf tea, diet coke  Answer theses question about you: Can you perform most household chores?Yes Do you find it hard to follow a conversation in a noisy room?No  Do you often ask people to speak up or repeat themselves?Yes  - but only with grandchildren - otherwise Do you feel that you have a problem with memory?No  Do you balance your checkbook and or bank acounts?Yes Do you feel safe at home?Yes Last dentist visit?Dentures Do you need assistance with any of the following: Please note if so   Driving?-NO  Feeding yourself?-No  Getting from bed to chair?-No  Getting to the toilet?-No  Bathing or showering?-No  Dressing yourself?-No  Managing money?-No  Climbing a flight  of stairs-No  Preparing meals?-No  Do you have Advanced Directives in place (Living Will, Healthcare Power or Attorney)? Yes   Last eye Exam and location?   May 2023, [facility] Dr. Renaldo Fiddler   Do you currently use prescribed or non-prescribed narcotic or opioid pain medications?No  Do you have a history or close family history of breast, ovarian, tubal or peritoneal cancer or a family member with BRCA (breast cancer susceptibility 1 and 2) gene mutations?  Nurse/Assistant Credentials/time stamp:St   ----------------------------------------------------------------------------------------------------------------------------------------------------------------------------------------------------------------------   MEDICARE ANNUAL PREVENTIVE VISIT WITH PROVIDER: (Welcome to [employer], initial annual wellness or annual wellness exam)  Virtual Visit via Phone Note  I connected with Shiran Claw on 04/12/22 by phone  and verified that I am speaking with the correct person using two identifiers.  Location patient: home Location provider:work or home office Persons participating in the virtual visit: patient, provider  Concerns and/or follow up today: reports is doing well.    See HM section in Epic for other details of completed HM.    ROS: negative for report of fevers, unintentional weight loss, vision changes, vision loss, hearing loss or change, chest pain, sob, hemoptysis, melena, hematochezia, hematuria, falls, bleeding or bruising, loc, thoughts of suicide or self harm, memory loss  Patient-completed extensive health risk assessment - reviewed and discussed with the patient: See Health Risk Assessment completed with patient prior to the visit either above or in recent phone note. This was reviewed in detailed with the patient today and appropriate recommendations, orders and referrals were placed as  needed per Summary below and patient instructions.   Review of Medical  History: -PMH, PSH, Family History and current specialty and care providers reviewed and updated and listed below   Patient Care Team: [name], NP as PCP - General (Family Medicine)   Past Medical History:  Diagnosis Date   Allergy    Baker's cyst    GERD (gastroesophageal reflux disease)    OA (osteoarthritis)    no per pt    Past Surgical History:  Procedure Laterality Date   APPENDECTOMY     COLONOSCOPY     TUBAL LIGATION      Social History   Socioeconomic History   Marital status: Widowed    Spouse name: Not on file   Number of children: Not on file   Years of education: Not on file   Highest education level: Not on file  Occupational History   Not on file  Tobacco Use   Smoking status: Never   Smokeless tobacco: Never  Vaping Use   Vaping Use: Never used  Substance and Sexual Activity   Alcohol use: No   Drug use: No   Sexual activity: Not on file  Other Topics Concern   Not on file  Social History Narrative   Lives alone in [place] style home with drive in garage downstairs, so she walks up stairs to her living space   Plans to age in place    No falls; no problem with stairs    Has 2 children who live near    3 grandchildren live near;    2 grands live away; one in  [place]; one going to  [employer] this summer   One going to [employer] in  [place] Strain: Low Risk  (03/14/2021)   Overall Financial Resource Strain (CARDIA)    Difficulty of Paying Living Expenses: Not hard at all  Food Insecurity: No Food Insecurity (03/14/2021)   Hunger Vital Sign    Worried About Running Out of Food in the Last Year: Never true    [place] in the Last Year: Never true  Transportation Needs: No Transportation Needs (03/14/2021)   PRAPARE - Hydrologist (Medical): No    Lack of Transportation (Non-Medical): No  Physical Activity: Sufficiently Active (03/14/2021)    Exercise Vital Sign    Days of Exercise per Week: 4 days    Minutes of Exercise per Session: 60 min  Stress: No Stress Concern Present (03/14/2021)   [place]    Feeling of Stress : Not at all  Social Connections: Not on file  Intimate Partner Violence: Not on file    Family History  Problem Relation Age of Onset   Diabetes Other    Stroke Other    Heart disease Other    Hypertension Other    Heart disease Mother    Heart disease Father    Diabetes Father    Colon cancer Neg Hx    Esophageal cancer Neg Hx    Stomach cancer Neg Hx    Rectal cancer Neg Hx     Current Outpatient Medications on File Prior to Visit  Medication Sig Dispense Refill   Calcium Carbonate (CALCIUM 600 PO) Take 1 tablet by mouth daily.     omeprazole (PRILOSEC OTC) 20 MG tablet Take 1 tablet (20 mg total) by mouth as needed.  100 tablet 4   No current facility-administered medications on file prior to visit.    No Known Allergies     Physical Exam There were no vitals filed for this visit. Estimated body mass index is 25.15 kg/m as calculated from the following:   Height as of 02/22/22: '5\' 3"'$  (1.6 m).   Weight as of 02/22/22: 142 lb (64.4 kg).  EKG (optional): deferred due to virtual visit  GENERAL: alert, oriented, no acute distress detected, full vision exam deferred due to pandemic and/or virtual encounter  PSYCH/NEURO: pleasant and cooperative, no obvious depression or anxiety, speech and thought processing grossly intact, Cognitive function grossly intact  Flowsheet Row Video Visit from 04/12/2022 in [place] at [facility]  PHQ-9 Total Score 0           04/12/2022    4:45 PM 02/22/2022    1:06 PM 03/14/2021    3:00 PM 01/13/2021    9:59 AM 06/11/2017    9:22 AM  Depression screen PHQ 2/9  Decreased Interest 0 0 0 0 0  Down, Depressed, Hopeless 0 0 0 0 0  PHQ - 2 Score 0 0 0 0 0  Altered  sleeping 0 0     Tired, decreased energy 0 0     Change in appetite 0 0     Feeling bad or failure about yourself  0 0     Trouble concentrating 0 0     Moving slowly or fidgety/restless 0 0     Suicidal thoughts 0 0     PHQ-9 Score 0 0     Difficult doing work/chores Not difficult at all Not difficult at all          01/12/2020    9:47 AM 01/13/2021    9:58 AM 03/14/2021    3:00 PM 02/22/2022    1:06 PM 04/12/2022    4:44 PM  Fall Risk  Falls in the past year? 1 1 0 0 0  Was there an injury with Fall? 1 1  0 0  Was there an injury with Fall? - Comments injured left side      Fall Risk Category Calculator 3 2  0 0  Fall Risk Category (Retired) High Moderate     (RETIRED) Patient Fall Risk Level High fall risk Moderate fall risk     Patient at Risk for Falls Due to   No Fall Risks No Fall Risks   Fall risk Follow up   Falls evaluation completed;Education provided;Falls prevention discussed Falls evaluation completed      SUMMARY AND PLAN:  Encounter for Medicare annual wellness exam    Discussed applicable health maintenance/preventive health measures and advised and referred or ordered per patient preferences:  Health Maintenance  Topic Date Due   COVID-19 Vaccine (4 - 2023-24 season) 04/28/2027 (Originally 09/29/2021) - declined   Zoster Vaccines- Shingrix (1 of 2) 07/13/2027 (Originally 04/30/1959) - declined   Medicare Annual Wellness (AWV)  04/12/2023   DTaP/Tdap/Td (3 - Tdap) 06/13/2026   Pneumonia Vaccine 29+ Years old  Completed   INFLUENZA VACCINE  Completed   DEXA SCAN  Completed   HPV VACCINES  Aged Out  Education and counseling on the following was provided based on the above review of health and a plan/checklist for the patient, along with additional information discussed, was provided for the patient in the patient instructions :   -Advised and counseled on maintaining healthy weight and healthy lifestyle - including the importance  of a healthy diet, regular  physical activity, social connections and stress management. -Advised and counseled on a whole foods based healthy diet and regular exercise. A summary of a healthy diet was provided in the Patient Instructions.Offered referral to dietician/weight management clinic if applicable and follow up virtual visits to assist further and monitor progress. Recommended regular exercise and discussed options for at home and within the community. Suggested since she reports she can't do the stair well to get up to her gym that she could try chair exercise and walking on flat surface. Discussed safe ways to do chair exercises.  -Advise yearly dental visits at minimum and regular eye exams   Follow up: see patient instructions     Patient Instructions  I really enjoyed getting to talk with you today! I am available on Tuesdays and Thursdays for virtual visits if you have any questions or concerns, or if I can be of any further assistance.   CHECKLIST FROM ANNUAL WELLNESS VISIT:  -Follow up (please call to schedule if not scheduled after visit):   -yearly for annual wellness visit with primary care office  Here is a list of your preventive care/health maintenance measures and the plan for each if any are due:  Health Maintenance  Topic Date Due   COVID-19 Vaccine (4 - 2023-24 season) 04/28/2027 (Originally 09/29/2021)   Zoster Vaccines- Shingrix (1 of 2) 07/13/2027 (Originally 04/30/1959)   Medicare Annual Wellness (AWV)  04/12/2023   DTaP/Tdap/Td (3 - Tdap) 06/13/2026   Pneumonia Vaccine 44+ Years old  Completed   INFLUENZA VACCINE  Completed   DEXA SCAN  Completed   HPV VACCINES  Aged Out    -See a dentist at least yearly  -Get your eyes checked and then per your eye specialist's recommendations  -Other issues addressed today:   -I have included below further information regarding a healthy whole foods based diet, physical activity guidelines for adults, stress management and opportunities for  social connections. I hope you find this information useful.   -----------------------------------------------------------------------------------------------------------------------------------------------------------------------------------------------------------------------------------------------------------  NUTRITION: -eat real food: lots of colorful vegetables (half the plate) and fruits -5-7 servings of vegetables and fruits per day (fresh or steamed is best), exp. 2 servings of vegetables with lunch and dinner and 2 servings of fruit per day. Berries and greens such as kale and collards are great choices.  -consume on a regular basis: whole grains (make sure first ingredient on label contains the word "whole"), fresh fruits, fish, nuts, seeds, healthy oils (such as olive oil, avocado oil, grape seed oil) -may eat small amounts of dairy and lean meat on occasion, but avoid processed meats such as ham, bacon, lunch meat, etc. -drink water -try to avoid fast food and pre-packaged foods, processed meat -most experts advise limiting sodium to < '2300mg'$  per day, should limit further is any chronic conditions such as high blood pressure, heart disease, diabetes, etc. The American Heart Association advised that < '1500mg'$  is is ideal -try to avoid foods that contain any ingredients with names you do not recognize  -try to avoid sugar/sweets (except for the natural sugar that occurs in fresh fruit) -try to avoid sweet drinks -try to avoid white rice, white bread, pasta (unless whole grain), white or yellow potatoes  EXERCISE GUIDELINES FOR ADULTS: -if you wish to increase your physical activity, do so gradually and with the approval of your doctor -STOP and seek medical care immediately if you have any chest pain, chest discomfort or trouble breathing when starting or  increasing exercise  -move and stretch your body, legs, feet and arms when sitting for long periods -Physical activity guidelines  for optimal health in adults: -least 150 minutes per week of aerobic exercise (can talk, but not sing) once approved by your doctor, 20-30 minutes of sustained activity or two 10 minute episodes of sustained activity every day.  -resistance training at least 2 days per week if approved by your doctor -balance exercises 3+ days per week:   Stand somewhere where you have something sturdy to hold onto if you lose balance.    1) lift up on toes, start with 5x per day and work up to 20x   2) stand and lift on leg straight out to the side so that foot is a few inches of the floor, start with 5x each side and work up to 20x each side   3) stand on one foot, start with 5 seconds each side and work up to 20 seconds on each side  If you need ideas or help with getting more active:  -Silver sneakers https://tools.silversneakers.com  -Walk with a Doc: http://stephens-thompson.biz/  -try to include resistance (weight lifting/strength building) and balance exercises twice per week: or the following link for ideas: ChessContest.fr  UpdateClothing.com.cy  STRESS MANAGEMENT: -can try meditating, or just sitting quietly with deep breathing while intentionally relaxing all parts of your body for 5 minutes daily -if you need further help with stress, anxiety or depression please follow up with your primary doctor or contact the wonderful folks at [place]: [place]: -options in [place] if you wish to engage in more social and exercise related activities:  -Silver sneakers https://tools.silversneakers.com  -Walk with a Doc: http://stephens-thompson.biz/  -Check out the [place] 50+ section on the [place] [employer] (hiking clubs, book clubs, cards and games, chess, exercise classes, aquatic classes and much more) - see the website for  details: https://www.[place]-.gov/departments/parks-recreation/active-adults50  -YouTube has lots of exercise videos for different ages and abilities as well  -[place] (a variety of indoor and outdoor inperson activities for adults). (581)010-5895. [address]  -Virtual Online Classes (a variety of topics): see seniorplanet.org or call [phone]  -consider volunteering at a school, hospice center, church, senior center or elsewhere           Lucretia Kern, DO

## 2022-04-12 NOTE — Patient Instructions (Addendum)
I really enjoyed getting to talk with you today! I am available on Tuesdays and Thursdays for virtual visits if you have any questions or concerns, or if I can be of any further assistance.   CHECKLIST FROM ANNUAL WELLNESS VISIT:  -Follow up (please call to schedule if not scheduled after visit):   -yearly for annual wellness visit with primary care office  Here is a list of your preventive care/health maintenance measures and the plan for each if any are due:  Health Maintenance  Topic Date Due   COVID-19 Vaccine (4 - 2023-24 season) 04/28/2027 (Originally 09/29/2021)   Zoster Vaccines- Shingrix (1 of 2) 07/13/2027 (Originally 04/30/1959)   Medicare Annual Wellness (AWV)  04/12/2023   DTaP/Tdap/Td (3 - Tdap) 06/13/2026   Pneumonia Vaccine 2+ Years old  Completed   INFLUENZA VACCINE  Completed   DEXA SCAN  Completed   HPV VACCINES  Aged Out    -See a dentist at least yearly  -Get your eyes checked and then per your eye specialist's recommendations  -Other issues addressed today: \ -I have included below further information regarding a healthy whole foods based diet, physical activity guidelines for adults, stress management and opportunities for social connections. I hope you find this information useful.   -----------------------------------------------------------------------------------------------------------------------------------------------------------------------------------------------------------------------------------------------------------  NUTRITION: -eat real food: lots of colorful vegetables (half the plate) and fruits -5-7 servings of vegetables and fruits per day (fresh or steamed is best), exp. 2 servings of vegetables with lunch and dinner and 2 servings of fruit per day. Berries and greens such as kale and collards are great choices.  -consume on a regular basis: whole grains (make sure first ingredient on label contains the word "whole"), fresh fruits, fish,  nuts, seeds, healthy oils (such as olive oil, avocado oil, grape seed oil) -may eat small amounts of dairy and lean meat on occasion, but avoid processed meats such as ham, bacon, lunch meat, etc. -drink water -try to avoid fast food and pre-packaged foods, processed meat -most experts advise limiting sodium to < '2300mg'$  per day, should limit further is any chronic conditions such as high blood pressure, heart disease, diabetes, etc. The American Heart Association advised that < '1500mg'$  is is ideal -try to avoid foods that contain any ingredients with names you do not recognize  -try to avoid sugar/sweets (except for the natural sugar that occurs in fresh fruit) -try to avoid sweet drinks -try to avoid white rice, white bread, pasta (unless whole grain), white or yellow potatoes  EXERCISE GUIDELINES FOR ADULTS: -if you wish to increase your physical activity, do so gradually and with the approval of your doctor -STOP and seek medical care immediately if you have any chest pain, chest discomfort or trouble breathing when starting or increasing exercise  -move and stretch your body, legs, feet and arms when sitting for long periods -Physical activity guidelines for optimal health in adults: -least 150 minutes per week of aerobic exercise (can talk, but not sing) once approved by your doctor, 20-30 minutes of sustained activity or two 10 minute episodes of sustained activity every day.  -resistance training at least 2 days per week if approved by your doctor -balance exercises 3+ days per week:   Stand somewhere where you have something sturdy to hold onto if you lose balance.    1) lift up on toes, start with 5x per day and work up to 20x   2) stand and lift on leg straight out to the side so that foot is  a few inches of the floor, start with 5x each side and work up to 20x each side   3) stand on one foot, start with 5 seconds each side and work up to 20 seconds on each side  If you need ideas or  help with getting more active:  -Silver sneakers https://tools.silversneakers.com  -Walk with a Doc: http://stephens-thompson.biz/  -try to include resistance (weight lifting/strength building) and balance exercises twice per week: or the following link for ideas: ChessContest.fr  UpdateClothing.com.cy  STRESS MANAGEMENT: -can try meditating, or just sitting quietly with deep breathing while intentionally relaxing all parts of your body for 5 minutes daily -if you need further help with stress, anxiety or depression please follow up with your primary doctor or contact the wonderful folks at [place]: [place]: -options in [place] if you wish to engage in more social and exercise related activities:  -Silver sneakers https://tools.silversneakers.com  -Walk with a Doc: http://stephens-thompson.biz/  -Check out the [place] 50+ section on the [place] [employer] (hiking clubs, book clubs, cards and games, chess, exercise classes, aquatic classes and much more) - see the website for details: https://www.[place].gov/departments/parks-recreation/active-adults50  -YouTube has lots of exercise videos for different ages and abilities as well  -[place] (a variety of indoor and outdoor inperson activities for adults). ([phone]. [address].  -Virtual Online Classes (a variety of topics): see seniorplanet.org or call ([phone]  -consider volunteering at a school, hospice center, church, senior center or elsewhere

## 2022-07-21 IMAGING — DX DG CHEST 2V
2 series · 2 of 2 positions shown · non-contrast
Comparison: None.

CLINICAL DATA: Left-sided flank pain after fall

EXAM:
CHEST - 2 VIEW

[chest pa]
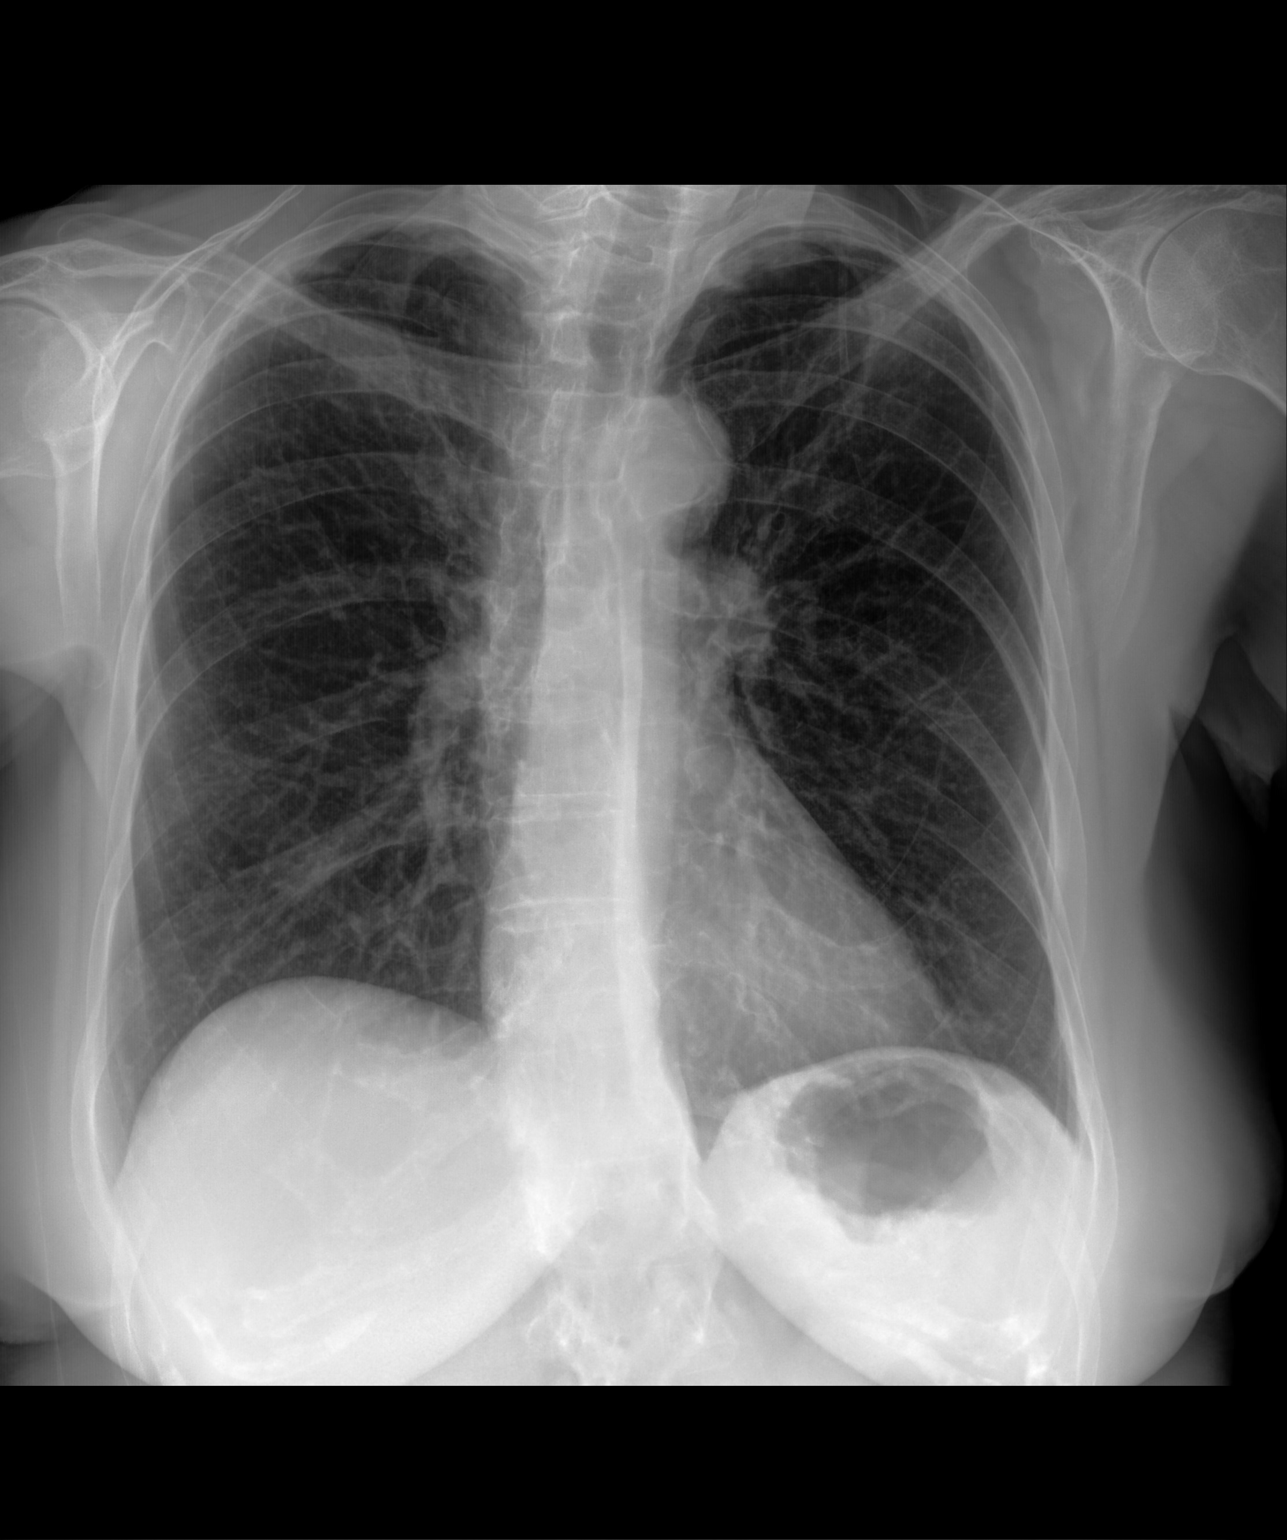

[chest lat]
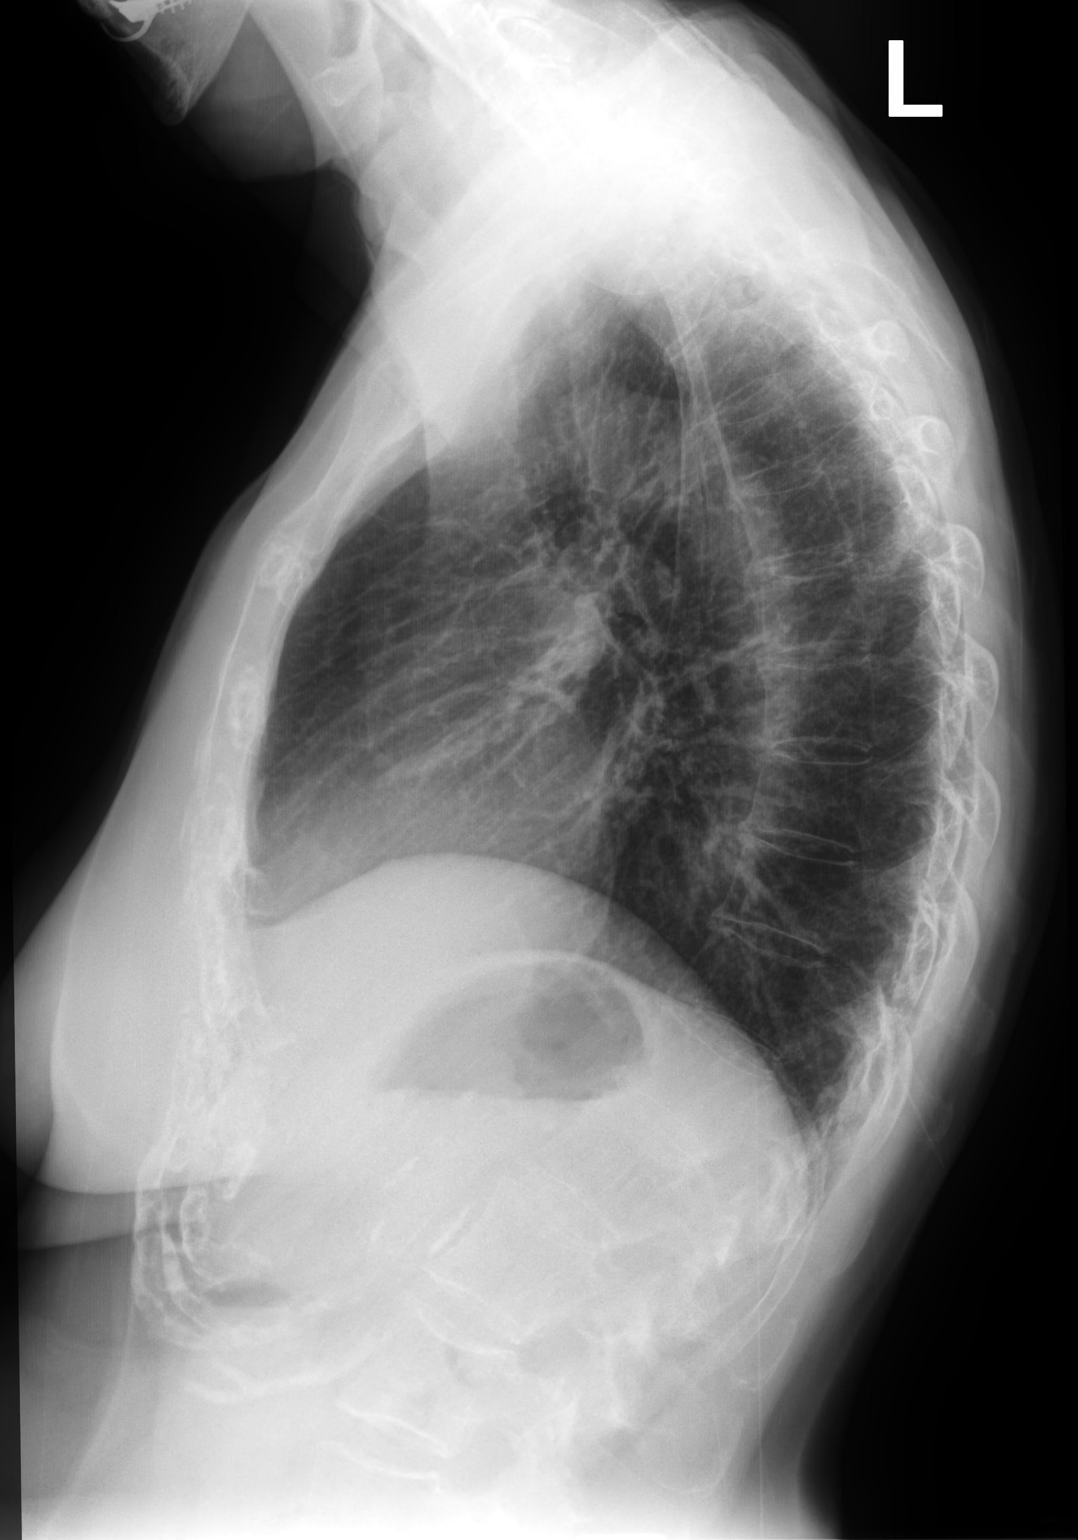

[2 of 2 positions shown; findings below may reference images not displayed]

FINDINGS: The heart size and mediastinal contours are within normal limits.
Both lungs are clear. Scoliosis of the thoracic spine.
IMPRESSION: No active cardiopulmonary disease.

## 2022-08-07 DIAGNOSIS — Z961 Presence of intraocular lens: Secondary | ICD-10-CM | POA: Diagnosis not present

## 2022-08-07 DIAGNOSIS — H43393 Other vitreous opacities, bilateral: Secondary | ICD-10-CM | POA: Diagnosis not present

## 2022-08-07 DIAGNOSIS — H26493 Other secondary cataract, bilateral: Secondary | ICD-10-CM | POA: Diagnosis not present

## 2022-08-07 DIAGNOSIS — H31113 Age-related choroidal atrophy, bilateral: Secondary | ICD-10-CM | POA: Diagnosis not present

## 2022-08-07 DIAGNOSIS — H04203 Unspecified epiphora, bilateral lacrimal glands: Secondary | ICD-10-CM | POA: Diagnosis not present

## 2022-08-30 ENCOUNTER — Encounter: Payer: Self-pay | Admitting: Adult Health

## 2022-08-30 NOTE — Telephone Encounter (Signed)
error 

## 2022-10-03 ENCOUNTER — Ambulatory Visit (INDEPENDENT_AMBULATORY_CARE_PROVIDER_SITE_OTHER): Payer: Medicare Other | Admitting: Podiatry

## 2022-10-03 DIAGNOSIS — L84 Corns and callosities: Secondary | ICD-10-CM

## 2022-10-03 DIAGNOSIS — L600 Ingrowing nail: Secondary | ICD-10-CM

## 2022-10-03 NOTE — Progress Notes (Unsigned)
   Chief Complaint  Patient presents with   Callouses    Left foot corn, painful   HPI: 82 y.o. female presents today with 2 concerns.  She has a recurring corn on the plantar aspect of the left foot near the submet 3 area.  She shaves this at home to get relief.  She was last seen in 2019 for the same issue.  Her second concern is a possible ingrown toenail on the left great toenail along the lateral border.  Denies any drainage.  She does note some thickness and discoloration to the nail.  Past Medical History:  Diagnosis Date   Allergy    Baker's cyst    GERD (gastroesophageal reflux disease)    OA (osteoarthritis)    no per pt    Past Surgical History:  Procedure Laterality Date   APPENDECTOMY     COLONOSCOPY     TUBAL LIGATION     No Known Allergies   Physical Exam: General: The patient is alert and oriented x3 in no acute distress.  Dermatology: Skin is warm, dry and supple bilateral lower extremities. Interspaces are clear of maceration and debris.  There is a focal hyperkeratotic lesion with pain on palpation submet 3 left foot.  No ulceration or surrounding erythema was noted.  No evidence of drainage is noted.  The left hallux lateral nail border is slightly incurvated with pain along the distal edge.  No drainage or erythema was noted  Vascular: Palpable pedal pulses bilaterally. Capillary refill within normal limits.  No appreciable edema.  No erythema or calor.  Neurological: Light touch sensation grossly intact bilateral feet.   Musculoskeletal Exam: Mildly plantarflexed third metatarsal left foot  Assessment/Plan of Care: 1. Corns   2. Ingrown toenail     Discussed clinical findings with patient today.  The hallux nail lateral border was cut back along the distal edge to obtain relief of discomfort.  She felt immediate improvement.  If this recurs she may need to have a PNA procedure performed.  She can begin applying Vicks vapor rub to the toenail every  night to help soften the nail make it easier to trim herself and also help with some of the appearance of the nail.  The hyperkeratotic lesion was shaved and Salinocaine was applied.  An offloading pad was placed on the underside of her shoe insole to keep pressure off the area and also recommended Reliaderm 40 cream to use every night on the corn to help prevent recurrence.  Follow-up as needed   Clerance Lav, DPM, FACFAS [facility]     [address] [place], [place] [address]                Office [phone]  Fax [phone]

## 2022-10-15 ENCOUNTER — Ambulatory Visit (INDEPENDENT_AMBULATORY_CARE_PROVIDER_SITE_OTHER): Payer: Medicare Other | Admitting: Orthopaedic Surgery

## 2022-10-15 DIAGNOSIS — M25561 Pain in right knee: Secondary | ICD-10-CM | POA: Diagnosis not present

## 2022-10-15 DIAGNOSIS — M25562 Pain in left knee: Secondary | ICD-10-CM

## 2022-10-15 DIAGNOSIS — G8929 Other chronic pain: Secondary | ICD-10-CM

## 2022-10-15 MED ORDER — LIDOCAINE HCL 1 % IJ SOLN
3.0000 mL | INTRAMUSCULAR | Status: AC | PRN
  Administered 2022-10-15: 3 mL

## 2022-10-15 MED ORDER — METHYLPREDNISOLONE ACETATE 40 MG/ML IJ SUSP
40.0000 mg | INTRAMUSCULAR | Status: AC | PRN
Start: 2022-10-15 — End: 2022-10-15
  Administered 2022-10-15: 40 mg via INTRA_ARTICULAR

## 2022-10-15 NOTE — Progress Notes (Signed)
The patient is an 82 year old female well-known to [place].  She has well-documented arthritis in both her knees.  She comes in today requesting steroid injections for her knees.  She is not a diabetic.  She tries to stay active and she does walk at her gym.  She last had steroid injection 7 months ago.  Those of only recently started to bother her again.  She says most of her pain is if she is been driving for a long period of time or sitting for a long period of time.  Also she has a problem going up and down stairs.  She has had no acute change in her medical status otherwise.  On exam both knees have slight valgus malalignment.  They have good range of motion but patellofemoral crepitation.  Both knees are stable ligamentously with no effusion.  Per her request I did place a sterile injection in both knees which she tolerated well.  We talked about exercises to avoid things that she can still do for her knees.  If things worsen she will let us know.  She knows to wait at least 3 to 4 months between steroid injections.     Procedure Note  Patient: Cheryl Franklin             Date of Birth: 12/07/1940           MRN: [id number]             Visit Date: 10/15/2022  Procedures: Visit Diagnoses:  1. Chronic pain of left knee   2. Chronic pain of right knee     Large Joint Inj: R knee on 10/15/2022 9:39 AM Indications: diagnostic evaluation and pain Details: 22 G 1.5 in needle, superolateral approach  Arthrogram: No  Medications: 3 mL lidocaine 1 %; 40 mg methylPREDNISolone acetate 40 MG/ML Outcome: tolerated well, no immediate complications Procedure, treatment alternatives, risks and benefits explained, specific risks discussed. Consent was given by the patient. Immediately prior to procedure a time out was called to verify the correct patient, procedure, equipment, support staff and site/side marked as required. Patient was prepped and draped in the usual sterile fashion.    Large Joint Inj: L knee  on 10/15/2022 9:40 AM Indications: diagnostic evaluation and pain Details: 22 G 1.5 in needle, superolateral approach  Arthrogram: No  Medications: 3 mL lidocaine 1 %; 40 mg methylPREDNISolone acetate 40 MG/ML Outcome: tolerated well, no immediate complications Procedure, treatment alternatives, risks and benefits explained, specific risks discussed. Consent was given by the patient. Immediately prior to procedure a time out was called to verify the correct patient, procedure, equipment, support staff and site/side marked as required. Patient was prepped and draped in the usual sterile fashion.

## 2023-02-01 ENCOUNTER — Telehealth: Payer: Self-pay

## 2023-02-01 DIAGNOSIS — E039 Hypothyroidism, unspecified: Secondary | ICD-10-CM

## 2023-02-01 DIAGNOSIS — E7849 Other hyperlipidemia: Secondary | ICD-10-CM

## 2023-02-01 DIAGNOSIS — Z0000 Encounter for general adult medical examination without abnormal findings: Secondary | ICD-10-CM

## 2023-02-01 DIAGNOSIS — K219 Gastro-esophageal reflux disease without esophagitis: Secondary | ICD-10-CM

## 2023-02-01 NOTE — Telephone Encounter (Signed)
 Copied from CRM [id number]. Topic: Clinical - Request for Lab/Test Order >> Feb 01, 2023 10:44 AM Franky GRADE wrote: Reason for CRM: Patient is scheduled for her physical on 02/27/2023 and would like to come in to do her blood work prior on 02/20/2023 so that results can be in by 02/27/2023

## 2023-02-05 NOTE — Telephone Encounter (Signed)
 Orders placed.

## 2023-02-05 NOTE — Addendum Note (Signed)
 Addended by: Waymon Amato R on: 02/05/2023 12:14 PM   Modules accepted: Orders

## 2023-02-18 ENCOUNTER — Other Ambulatory Visit (INDEPENDENT_AMBULATORY_CARE_PROVIDER_SITE_OTHER): Payer: Medicare Other

## 2023-02-18 DIAGNOSIS — E7849 Other hyperlipidemia: Secondary | ICD-10-CM | POA: Diagnosis not present

## 2023-02-18 DIAGNOSIS — E039 Hypothyroidism, unspecified: Secondary | ICD-10-CM

## 2023-02-18 DIAGNOSIS — K219 Gastro-esophageal reflux disease without esophagitis: Secondary | ICD-10-CM

## 2023-02-18 DIAGNOSIS — Z0000 Encounter for general adult medical examination without abnormal findings: Secondary | ICD-10-CM

## 2023-02-18 LAB — CBC WITH DIFFERENTIAL/PLATELET
Basophils Absolute: 0 10*3/uL (ref 0.0–0.1)
Basophils Relative: 0.6 % (ref 0.0–3.0)
Eosinophils Absolute: 0.2 10*3/uL (ref 0.0–0.7)
Eosinophils Relative: 2.6 % (ref 0.0–5.0)
HCT: 41.8 % (ref 36.0–46.0)
Hemoglobin: 13.9 g/dL (ref 12.0–15.0)
Lymphocytes Relative: 38.3 % (ref 12.0–46.0)
Lymphs Abs: 2.3 10*3/uL (ref 0.7–4.0)
MCHC: 33.3 g/dL (ref 30.0–36.0)
MCV: 94.3 fL (ref 78.0–100.0)
Monocytes Absolute: 0.4 10*3/uL (ref 0.1–1.0)
Monocytes Relative: 5.8 % (ref 3.0–12.0)
Neutro Abs: 3.2 10*3/uL (ref 1.4–7.7)
Neutrophils Relative %: 52.7 % (ref 43.0–77.0)
Platelets: 249 10*3/uL (ref 150.0–400.0)
RBC: 4.43 Mil/uL (ref 3.87–5.11)
RDW: 14 % (ref 11.5–15.5)
WBC: 6 10*3/uL (ref 4.0–10.5)

## 2023-02-18 LAB — COMPREHENSIVE METABOLIC PANEL
ALT: 12 U/L (ref 0–35)
AST: 18 U/L (ref 0–37)
Albumin: 4.5 g/dL (ref 3.5–5.2)
Alkaline Phosphatase: 85 U/L (ref 39–117)
BUN: 12 mg/dL (ref 6–23)
CO2: 28 meq/L (ref 19–32)
Calcium: 9.4 mg/dL (ref 8.4–10.5)
Chloride: 103 meq/L (ref 96–112)
Creatinine, Ser: 0.67 mg/dL (ref 0.40–1.20)
GFR: 81.75 mL/min (ref >60.00)
Glucose, Bld: 85 mg/dL (ref 70–99)
Potassium: 4 meq/L (ref 3.5–5.1)
Sodium: 139 meq/L (ref 135–145)
Total Bilirubin: 0.5 mg/dL (ref 0.2–1.2)
Total Protein: 7.6 g/dL (ref 6.0–8.3)

## 2023-02-18 LAB — LIPID PANEL
Cholesterol: 225 mg/dL — ABNORMAL HIGH (ref 0–200)
HDL: 68.2 mg/dL (ref >39.00)
LDL Cholesterol: 141 mg/dL — ABNORMAL HIGH (ref 0–99)
NonHDL: 156.44
Total CHOL/HDL Ratio: 3
Triglycerides: 76 mg/dL (ref 0.0–149.0)
VLDL: 15.2 mg/dL (ref 0.0–40.0)

## 2023-02-18 LAB — TSH: TSH: 3.87 u[IU]/mL (ref 0.35–5.50)

## 2023-02-19 ENCOUNTER — Encounter: Payer: Self-pay | Admitting: Adult Health

## 2023-02-20 ENCOUNTER — Other Ambulatory Visit: Payer: Medicare Other

## 2023-02-27 ENCOUNTER — Encounter: Payer: Self-pay | Admitting: Adult Health

## 2023-02-27 ENCOUNTER — Ambulatory Visit (INDEPENDENT_AMBULATORY_CARE_PROVIDER_SITE_OTHER): Payer: Medicare Other | Admitting: Adult Health

## 2023-02-27 VITALS — BP 120/80 | HR 73 | Temp 98.1°F | Ht 63.0 in | Wt 142.0 lb

## 2023-02-27 DIAGNOSIS — M170 Bilateral primary osteoarthritis of knee: Secondary | ICD-10-CM | POA: Diagnosis not present

## 2023-02-27 DIAGNOSIS — Z23 Encounter for immunization: Secondary | ICD-10-CM | POA: Diagnosis not present

## 2023-02-27 DIAGNOSIS — E7849 Other hyperlipidemia: Secondary | ICD-10-CM | POA: Diagnosis not present

## 2023-02-27 NOTE — Progress Notes (Signed)
Subjective:    Patient ID: Cheryl Franklin, female    DOB: 1940/03/21, 83 y.o.   MRN: [id number]  HPI  Patient presents for yearly preventative medicine examination. He is a pleasant 83 year old female who  has a past medical history of Allergy, Baker's cyst, GERD (gastroesophageal reflux disease), and OA (osteoarthritis).   Hyperlipidemia - refuses statin medication. She understands her risks including stroke and MI.   Lab Results  Component Value Date   CHOL 225 (H) 02/18/2023   HDL 68.20 02/18/2023   LDLCALC 141 (H) 02/18/2023   LDLDIRECT 123.7 04/16/2011   TRIG 76.0 02/18/2023   CHOLHDL 3 02/18/2023    Chronic knee pain - managed by orthopedics. Has steroid injections periodically and reports that the pain  resolves for about 3-4 months. She is going to follow up with Dr. Magnus Ivan.    All immunizations and health maintenance protocols were reviewed with the patient and needed orders were placed.  Appropriate screening laboratory values were ordered for the patient including screening of hyperlipidemia, renal function and hepatic function.  Medication reconciliation,  past medical history, social history, problem list and allergies were reviewed in detail with the patient  Goals were established with regard to weight loss, exercise, and  diet in compliance with medications. She does stay active and eat healthy..  Wt Readings from Last 3 Encounters:  02/27/23 142 lb (64.4 kg)  02/22/22 142 lb (64.4 kg)  03/14/21 142 lb (64.4 kg)   Review of Systems  Constitutional: Negative.   HENT: Negative.    Eyes: Negative.   Respiratory: Negative.    Cardiovascular: Negative.   Gastrointestinal: Negative.   Endocrine: Negative.   Genitourinary: Negative.   Musculoskeletal: Negative.   Skin: Negative.   Allergic/Immunologic: Negative.   Neurological: Negative.   Hematological: Negative.   Psychiatric/Behavioral: Negative.     Past Medical History:  Diagnosis Date   Allergy     Baker's cyst    GERD (gastroesophageal reflux disease)    OA (osteoarthritis)    no per pt    Social History   Socioeconomic History   Marital status: Widowed    Spouse name: Not on file   Number of children: Not on file   Years of education: Not on file   Highest education level: Not on file  Occupational History   Not on file  Tobacco Use   Smoking status: Never   Smokeless tobacco: Never  Vaping Use   Vaping status: Never Used  Substance and Sexual Activity   Alcohol use: No   Drug use: No   Sexual activity: Not on file  Other Topics Concern   Not on file  Social History Narrative   Lives alone in [place] style home with drive in garage downstairs, so she walks up stairs to her living space   Plans to age in place    No falls; no problem with stairs    Has 2 children who live near    3 grandchildren live near;    2 grands live away; one in  [place]; one going to  [employer] this summer   One going to [employer] in  [place]    Social Drivers of Health   Financial Resource Strain: Low Risk  (03/14/2021)   Overall Financial Resource Strain (CARDIA)    Difficulty of Paying Living Expenses: Not hard at all  Food Insecurity: No Food Insecurity (03/14/2021)   Hunger Vital Sign    Worried About Running Out  of Food in the Last Year: Never true    Ran Out of Food in the Last Year: Never true  Transportation Needs: No Transportation Needs (03/14/2021)   PRAPARE - Administrator, Civil Service (Medical): No    Lack of Transportation (Non-Medical): No  Physical Activity: Sufficiently Active (03/14/2021)   Exercise Vital Sign    Days of Exercise per Week: 4 days    Minutes of Exercise per Session: 60 min  Stress: No Stress Concern Present (03/14/2021)   [employer] of Occupational Health - Occupational Stress Questionnaire    Feeling of Stress : Not at all  Social Connections: Not on file  Intimate Partner Violence: Not on file    Past Surgical  History:  Procedure Laterality Date   APPENDECTOMY     COLONOSCOPY     TUBAL LIGATION      Family History  Problem Relation Age of Onset   Diabetes Other    Stroke Other    Heart disease Other    Hypertension Other    Heart disease Mother    Heart disease Father    Diabetes Father    Colon cancer Neg Hx    Esophageal cancer Neg Hx    Stomach cancer Neg Hx    Rectal cancer Neg Hx     No Known Allergies  Current Outpatient Medications on File Prior to Visit  Medication Sig Dispense Refill   Calcium Carbonate (CALCIUM 600 PO) Take 1 tablet by mouth daily.     No current facility-administered medications on file prior to visit.    BP 120/80   Pulse 73   Temp 98.1 F (36.7 C) (Oral)   Ht 5\' 3"  (1.6 m)   Wt 142 lb (64.4 kg)   SpO2 97%   BMI 25.15 kg/m       Objective:   Physical Exam Vitals and nursing note reviewed.  Constitutional:      General: She is not in acute distress.    Appearance: Normal appearance. She is not ill-appearing.  HENT:     Head: Normocephalic and atraumatic.     Right Ear: Tympanic membrane, ear canal and external ear normal. There is no impacted cerumen.     Left Ear: Tympanic membrane, ear canal and external ear normal. There is no impacted cerumen.     Nose: Nose normal. No congestion or rhinorrhea.     Mouth/Throat:     Mouth: Mucous membranes are moist.     Pharynx: Oropharynx is clear.  Eyes:     Extraocular Movements: Extraocular movements intact.     Conjunctiva/sclera: Conjunctivae normal.     Pupils: Pupils are equal, round, and reactive to light.  Neck:     Vascular: No carotid bruit.  Cardiovascular:     Rate and Rhythm: Normal rate and regular rhythm.     Pulses: Normal pulses.     Heart sounds: No murmur heard.    No friction rub. No gallop.  Pulmonary:     Effort: Pulmonary effort is normal.     Breath sounds: Normal breath sounds.  Abdominal:     General: Abdomen is flat. Bowel sounds are normal. There is no  distension.     Palpations: Abdomen is soft. There is no mass.     Tenderness: There is no abdominal tenderness. There is no guarding or rebound.     Hernia: No hernia is present.  Musculoskeletal:        General: Normal range of  motion.     Cervical back: Normal range of motion and neck supple.  Lymphadenopathy:     Cervical: No cervical adenopathy.  Skin:    General: Skin is warm and dry.     Capillary Refill: Capillary refill takes less than 2 seconds.  Neurological:     General: No focal deficit present.     Mental Status: She is alert and oriented to person, place, and time.  Psychiatric:        Mood and Affect: Mood normal.        Behavior: Behavior normal.        Thought Content: Thought content normal.        Judgment: Judgment normal.        Assessment & Plan:  1. Other hyperlipidemia (Primary) - Reviewed her lab work including CBC, CMP, Lipid and TSH that she had done last week. All were normal besides her cholesterol panel.  - Refuses statin. She understands her risks including stroke and MI. - Continue to eat healthy and exercise  - Follow up in 1 year or sooner if needed.    2. Primary osteoarthritis of both knees - Follow up with orthopedics as directed   3. Need for influenza vaccination  - Flu Vaccine Trivalent High Dose (Fluad)

## 2023-02-27 NOTE — Patient Instructions (Signed)
It was great seeing you today   We will follow up with you regarding your lab work   Please let me know if you need anything

## 2023-05-21 ENCOUNTER — Other Ambulatory Visit: Payer: Self-pay | Admitting: Adult Health

## 2023-05-21 ENCOUNTER — Ambulatory Visit (INDEPENDENT_AMBULATORY_CARE_PROVIDER_SITE_OTHER)

## 2023-05-21 ENCOUNTER — Ambulatory Visit (INDEPENDENT_AMBULATORY_CARE_PROVIDER_SITE_OTHER): Admitting: Adult Health

## 2023-05-21 ENCOUNTER — Encounter: Payer: Self-pay | Admitting: Adult Health

## 2023-05-21 VITALS — BP 150/80 | HR 45 | Temp 98.0°F | Ht 63.0 in | Wt 145.0 lb

## 2023-05-21 DIAGNOSIS — N644 Mastodynia: Secondary | ICD-10-CM

## 2023-05-21 DIAGNOSIS — J479 Bronchiectasis, uncomplicated: Secondary | ICD-10-CM | POA: Diagnosis not present

## 2023-05-21 DIAGNOSIS — R0781 Pleurodynia: Secondary | ICD-10-CM

## 2023-05-21 DIAGNOSIS — K625 Hemorrhage of anus and rectum: Secondary | ICD-10-CM | POA: Diagnosis not present

## 2023-05-21 NOTE — Progress Notes (Addendum)
 Subjective:    Patient ID: Cheryl Franklin, female    DOB: March 25, 1940, 83 y.o.   MRN: [id number]  HPI  83 year old female who  has a past medical history of Allergy, Baker's cyst, GERD (gastroesophageal reflux disease), and OA (osteoarthritis).  She presents to the office today for concern of left sided breast pain that has been present off and on for the last two months. When she made the appointment about a week ago the pain was worse. Pain is felt as a soreness. At other times she will feel a tightness that goes across her upper abdomen that feels like " my bra is too tight". She has not noticed any changes in her breasts such as nipple inversion or discharge, lumps or masses.   Additionally she reports that yesterday she noticed bright red blood when she wiped after a bowel movement. She has had a bowel movement since that did not have any blood. She denies lower abdominal pain, rectal pain or itching,  changes in bowel movements, or constipation. She does have a history of hemorrhoids.   Review of Systems See HPI   Past Medical History:  Diagnosis Date   Allergy    Baker's cyst    GERD (gastroesophageal reflux disease)    OA (osteoarthritis)    no per pt    Social History   Socioeconomic History   Marital status: Widowed    Spouse name: Not on file   Number of children: Not on file   Years of education: Not on file   Highest education level: Not on file  Occupational History   Not on file  Tobacco Use   Smoking status: Never   Smokeless tobacco: Never  Vaping Use   Vaping status: Never Used  Substance and Sexual Activity   Alcohol use: No   Drug use: No   Sexual activity: Not on file  Other Topics Concern   Not on file  Social History Narrative   Lives alone in [place] style home with drive in garage downstairs, so she walks up stairs to her living space   Plans to age in place    No falls; no problem with stairs    Has 2 children who live near    3 grandchildren live  near;    2 grands live away; one in  [place]; one going to  [employer] this summer   One going to [employer] in  [place]    Social Drivers of Health   Financial Resource Strain: Low Risk  (03/14/2021)   Overall Financial Resource Strain (CARDIA)    Difficulty of Paying Living Expenses: Not hard at all  Food Insecurity: No Food Insecurity (03/14/2021)   Hunger Vital Sign    Worried About Running Out of Food in the Last Year: Never true    Ran Out of Food in the Last Year: Never true  Transportation Needs: No Transportation Needs (03/14/2021)   PRAPARE - Administrator, Civil Service (Medical): No    Lack of Transportation (Non-Medical): No  Physical Activity: Sufficiently Active (03/14/2021)   Exercise Vital Sign    Days of Exercise per Week: 4 days    Minutes of Exercise per Session: 60 min  Stress: No Stress Concern Present (03/14/2021)   [employer] of Occupational Health - Occupational Stress Questionnaire    Feeling of Stress : Not at all  Social Connections: Not on file  Intimate Partner Violence: Not on file  Past Surgical History:  Procedure Laterality Date   APPENDECTOMY     COLONOSCOPY     TUBAL LIGATION      Family History  Problem Relation Age of Onset   Diabetes Other    Stroke Other    Heart disease Other    Hypertension Other    Heart disease Mother    Heart disease Father    Diabetes Father    Colon cancer Neg Hx    Esophageal cancer Neg Hx    Stomach cancer Neg Hx    Rectal cancer Neg Hx     No Known Allergies  Current Outpatient Medications on File Prior to Visit  Medication Sig Dispense Refill   Calcium Carbonate (CALCIUM 600 PO) Take 1 tablet by mouth daily.     No current facility-administered medications on file prior to visit.    BP (!) 150/80   Pulse (!) 45   Temp 98 F (36.7 C)   Wt 145 lb (65.8 kg)   SpO2 96%   BMI 25.69 kg/m       Objective:   Physical Exam Vitals and nursing note reviewed.   Constitutional:      Appearance: Normal appearance.  Cardiovascular:     Rate and Rhythm: Normal rate and regular rhythm.     Pulses: Normal pulses.     Heart sounds: Normal heart sounds.  Pulmonary:     Effort: Pulmonary effort is normal.     Breath sounds: Normal breath sounds.  Chest:     Chest wall: Tenderness present. No mass, deformity, swelling, crepitus or edema.  Breasts:    Right: Normal.     Left: Tenderness present. No swelling, bleeding, inverted nipple, mass, nipple discharge or skin change.    Abdominal:     General: Abdomen is flat. Bowel sounds are normal.  Genitourinary:    Rectum: Guaiac result negative. No mass, anal fissure, external hemorrhoid or internal hemorrhoid. Normal anal tone.  Musculoskeletal:        General: Normal range of motion.  Lymphadenopathy:     Upper Body:     Left upper body: No supraclavicular, axillary or pectoral adenopathy.  Skin:    General: Skin is warm and dry.     Capillary Refill: Capillary refill takes less than 2 seconds.  Neurological:     General: No focal deficit present.     Mental Status: She is alert and oriented to person, place, and time.  Psychiatric:        Mood and Affect: Mood normal.        Behavior: Behavior normal.        Thought Content: Thought content normal.        Judgment: Judgment normal.       Assessment & Plan:  1. Rib pain on left side (Primary) - She was unsure of how long this pain had been present.  - No masses or lumps felt. Possibly costochondritis vs muscle strain. No signs of pancreatitis or organ involvement.  - Will order Chest xray today. Consider advanced imaging.  - Can take NSAIDS - DG Chest 2 View; Future  2. Breast pain, left - Will order diagnostic mammogram due to pain with palpation  - MM 3D DIAGNOSTIC MAMMOGRAM UNILATERAL LEFT BREAST; Future  3. Rectal bleeding - Negative hemoccult  - likely form resolved hemorrhoid.  - Follow up with rectal bleeding continues    Alto Atta, NP  Time spent with patient today was 41 minutes which consisted  of chart review, discussing left sided rib pain, left breast pain, and rectal bleeding, work up, treatment answering questions and documentation.

## 2023-05-21 NOTE — Patient Instructions (Addendum)
 It was great seeing you today   I am going to order a chest xray today   [facility] am [facility]

## 2023-06-05 ENCOUNTER — Ambulatory Visit
Admission: RE | Admit: 2023-06-05 | Discharge: 2023-06-05 | Disposition: A | Discharge status: Home / Self Care | Source: Ambulatory Visit | Attending: Adult Health

## 2023-06-05 ENCOUNTER — Ambulatory Visit

## 2023-06-05 DIAGNOSIS — N644 Mastodynia: Secondary | ICD-10-CM | POA: Diagnosis not present

## 2023-07-31 DIAGNOSIS — L820 Inflamed seborrheic keratosis: Secondary | ICD-10-CM | POA: Diagnosis not present

## 2023-07-31 DIAGNOSIS — L570 Actinic keratosis: Secondary | ICD-10-CM | POA: Diagnosis not present

## 2023-09-05 ENCOUNTER — Ambulatory Visit (INDEPENDENT_AMBULATORY_CARE_PROVIDER_SITE_OTHER): Admitting: Orthopaedic Surgery

## 2023-09-05 ENCOUNTER — Encounter: Payer: Self-pay | Admitting: Orthopaedic Surgery

## 2023-09-05 DIAGNOSIS — M25561 Pain in right knee: Secondary | ICD-10-CM

## 2023-09-05 DIAGNOSIS — M25562 Pain in left knee: Secondary | ICD-10-CM

## 2023-09-05 DIAGNOSIS — G8929 Other chronic pain: Secondary | ICD-10-CM

## 2023-09-05 MED ORDER — METHYLPREDNISOLONE ACETATE 40 MG/ML IJ SUSP
40.0000 mg | INTRAMUSCULAR | Status: AC | PRN
  Administered 2023-09-05: 40 mg via INTRA_ARTICULAR

## 2023-09-05 MED ORDER — LIDOCAINE HCL 1 % IJ SOLN
3.0000 mL | INTRAMUSCULAR | Status: AC | PRN
  Administered 2023-09-05: 3 mL

## 2023-09-05 NOTE — Progress Notes (Signed)
 The patient is an 83 year old female we have seen in the past.  She has osteoarthritis of both her knees with valgus malalignment.  It has been 11 months since she has had a steroid injection in either knee.  She said they started hurting about a month ago.  She said she still is not ready for knee replacement surgery.    On exam both knees have valgus malalignment but no effusion.  Both knees have pain throughout the arc of motion of the knee with the left knee worse than the right knee in terms of her pain.  Per the patient's request we did place a steroid injection in both knees which she tolerated well.  If things worsen in any way she knows to let us  know and she also knows she can come in earlier for steroid injections if needed.    Procedure Note  Patient: Cheryl Franklin             Date of Birth: 06-15-1940           MRN: [id number]             Visit Date: 09/05/2023  Procedures: Visit Diagnoses:  1. Chronic pain of left knee   2. Chronic pain of right knee     Large Joint Inj: R knee on 09/05/2023 3:17 PM Indications: diagnostic evaluation and pain Details: 22 G 1.5 in needle, superolateral approach  Arthrogram: No  Medications: 3 mL lidocaine  1 %; 40 mg methylPREDNISolone  acetate 40 MG/ML Outcome: tolerated well, no immediate complications Procedure, treatment alternatives, risks and benefits explained, specific risks discussed. Consent was given by the patient. Immediately prior to procedure a time out was called to verify the correct patient, procedure, equipment, support staff and site/side marked as required. Patient was prepped and draped in the usual sterile fashion.    Large Joint Inj: L knee on 09/05/2023 3:17 PM Indications: diagnostic evaluation and pain Details: 22 G 1.5 in needle, superolateral approach  Arthrogram: No  Medications: 3 mL lidocaine  1 %; 40 mg methylPREDNISolone  acetate 40 MG/ML Outcome: tolerated well, no immediate complications Procedure, treatment  alternatives, risks and benefits explained, specific risks discussed. Consent was given by the patient. Immediately prior to procedure a time out was called to verify the correct patient, procedure, equipment, support staff and site/side marked as required. Patient was prepped and draped in the usual sterile fashion.

## 2023-10-26 DIAGNOSIS — R509 Fever, unspecified: Secondary | ICD-10-CM | POA: Diagnosis not present

## 2023-10-26 DIAGNOSIS — R0981 Nasal congestion: Secondary | ICD-10-CM | POA: Diagnosis not present

## 2023-10-26 DIAGNOSIS — R051 Acute cough: Secondary | ICD-10-CM | POA: Diagnosis not present

## 2023-10-26 DIAGNOSIS — J101 Influenza due to other identified influenza virus with other respiratory manifestations: Secondary | ICD-10-CM | POA: Diagnosis not present

## 2023-12-02 ENCOUNTER — Encounter: Payer: Self-pay | Admitting: Radiology

## 2024-01-09 ENCOUNTER — Ambulatory Visit (INDEPENDENT_AMBULATORY_CARE_PROVIDER_SITE_OTHER): Admitting: Family Medicine

## 2024-01-09 DIAGNOSIS — Z0000 Encounter for general adult medical examination without abnormal findings: Secondary | ICD-10-CM

## 2024-01-09 NOTE — Patient Instructions (Signed)
 I really enjoyed getting to talk with you today! I am available on Tuesdays and Thursdays for virtual visits if you have any questions or concerns, or if I can be of any further assistance.   CHECKLIST FROM ANNUAL WELLNESS VISIT:  -Follow up (please call to schedule if not scheduled after visit):   -yearly for annual wellness visit with primary care office  Here is a list of your preventive care/health maintenance measures and the plan for each if any are due:  PLAN For any measures below that may be due:   Health Maintenance  Topic Date Due   Medicare Annual Wellness (AWV)  04/12/2023   COVID-19 Vaccine (4 - 2025-26 season) 04/28/2027 (Originally 09/30/2023)   Zoster Vaccines- Shingrix (1 of 2) 07/13/2027 (Originally 04/30/1959)   DTaP/Tdap/Td (3 - Tdap) 06/13/2026   Pneumococcal Vaccine: 50+ Years  Completed   Influenza Vaccine  Completed   Bone Density Scan  Completed   Meningococcal B Vaccine  Aged Out    -See a dentist at least yearly  -Get your eyes checked and then per your eye specialist's recommendations  -Other issues addressed today:   -I have included below further information regarding a healthy whole foods based diet, physical activity guidelines for adults, stress management and opportunities for social connections. I hope you find this information useful.   -----------------------------------------------------------------------------------------------------------------------------------------------------------------------------------------------------------------------------------------------------------    NUTRITION: -eat real food: lots of colorful vegetables (half the plate) and fruits -5-7 servings of vegetables and fruits per day (fresh or steamed is best), exp. 2 servings of vegetables with lunch and dinner and 2 servings of fruit per day. Berries and greens such as kale and collards are great choices.  -consume on a regular basis:  fresh fruits, fresh  veggies, fish, nuts, seeds, healthy oils (such as olive oil, avocado oil), whole grains (make sure for bread/pasta/crackers/etc., that the first ingredient on label contains the word whole), legumes. -can eat small amounts of dairy and lean meat (no larger than the palm of your hand), but avoid processed meats such as ham, bacon, lunch meat, etc. -drink water -try to avoid fast food and pre-packaged foods, processed meat, ultra processed foods/beverages (donuts, candy, etc.) -most experts advise limiting sodium to < 2300mg  per day, should limit further is any chronic conditions such as high blood pressure, heart disease, diabetes, etc. The American Heart Association advised that < 1500mg  is is ideal -try to avoid foods/beverages that contain any ingredients with names you do not recognize  -try to avoid foods/beverages  with added sugar or sweeteners/sweets  -try to avoid sweet drinks (including diet drinks): soda, juice, Gatorade, sweet tea, power drinks, diet drinks -try to avoid white rice, white bread, pasta (unless whole grain)  EXERCISE GUIDELINES FOR ADULTS: -if you wish to increase your physical activity, do so gradually and with the approval of your doctor -STOP and seek medical care immediately if you have any chest pain, chest discomfort or trouble breathing when starting or increasing exercise  -move and stretch your body, legs, feet and arms when sitting for long periods -Physical activity guidelines for optimal health in adults: -get at least 150 minutes per week of moderate exercise (can talk, but not sing); this is about 20-30 minutes of sustained activity 5-7 days per week or two 10-15 minute episodes of sustained activity 5-7 days per week -do some muscle building/resistance training/strength training at least 2 days per week  -balance exercises 3+ days per week:   Stand somewhere where you have something sturdy  to hold onto if you lose balance    1) lift up on toes, then back  down, start with 5x per day and work up to 20x   2) stand and lift one leg straight out to the side so that foot is a few inches of the floor, start with 5x each side and work up to 20x each side   3) stand on one foot, start with 5 seconds each side and work up to 20 seconds on each side  If you need ideas or help with getting more active:  -Silver sneakers https://tools.silversneakers.com  -Walk with a Doc: Http://www.duncan-williams.com/  -try to include resistance (weight lifting/strength building) and balance exercises twice per week: or the following link for ideas: http://castillo-powell.com/  buyducts.dk  STRESS MANAGEMENT: -can try meditating, or just sitting quietly with deep breathing while intentionally relaxing all parts of your body for 5 minutes daily -if you need further help with stress, anxiety or depression please follow up with your primary doctor or contact the wonderful folks at [facility]: [phone]  SOCIAL CONNECTIONS: -options in [place] if you wish to engage in more social and exercise related activities:  -Silver sneakers https://tools.silversneakers.com  -Walk with a Doc: Http://www.duncan-williams.com/  -Check out the [facility] Active Adults 50+ section on the [place] of [employer] companies (hiking clubs, book clubs, cards and games, chess, exercise classes, aquatic classes and much more) - see the website for details: https://www.[place].gov/departments/parks-recreation/active-adults50  -YouTube has lots of exercise videos for different ages and abilities as well  -[facility] (a variety of indoor and outdoor inperson activities for adults). [phone]. [address].  -Virtual Online Classes (a variety of topics): see seniorplanet.org or call [phone]  -consider volunteering at a school, hospice center, church, senior  center or elsewhere

## 2024-01-09 NOTE — Progress Notes (Signed)
 ----------------------------------------------------------------------------------------------------------------------------------------------------------------------------------------------------------------------  Because this visit was a virtual/telehealth visit, some criteria may be missing or patient reported. Any vitals not documented were not able to be obtained and vitals that have been documented are patient reported.    MEDICARE ANNUAL PREVENTIVE VISIT WITH PROVIDER: (Welcome to Medicare, initial annual wellness or annual wellness exam)  Virtual Visit via Video Note  I connected with Cheryl Franklin on 01/09/2024 by a video enabled telemedicine application and verified that I am speaking with the correct person using two identifiers.  Location patient: home Location provider:work or home office Persons participating in the virtual visit: patient, provider  Concerns and/or follow up today: detailed intake and risks/health assessment completed in flowsheets and below - please see for details. No Concerns.   How often do you have a drink containing alcohol?n How many drinks containing alcohol do you have on a typical day when you are drinking?na How often do you have six or more drinks on one occasion?na Have you ever smoked?n Quit date if applicable? na  How many packs a day do/did you smoke? na Do you use smokeless tobacco?n Do you use an illicit drugs?n Do you feel safe at home?y Last dentist visit?has dentures Last eye Exam and location?[facility] - last visit was about a year ago   See HM section in Epic for other details of completed HM.    ROS: negative for report of fevers, unintentional weight loss, vision changes, vision loss, hearing loss or change, chest pain, sob, hemoptysis, melena, hematochezia, hematuria or bleeding or bruising  Patient-completed extensive health risk assessment - reviewed and discussed with the patient: See Health Risk Assessment completed  with patient prior to the visit either above or in recent phone note. This was reviewed in detailed with the patient today and appropriate recommendations, orders and referrals were placed as needed per Summary below and patient instructions.   Review of Medical History: -PMH, PSH, Family History and current specialty and care providers reviewed and updated and listed below   Patient Care Team: Merna Huxley, NP as PCP - General (Family Medicine)   Past Medical History:  Diagnosis Date   Allergy    Baker's cyst    GERD (gastroesophageal reflux disease)    OA (osteoarthritis)    no per pt    Past Surgical History:  Procedure Laterality Date   APPENDECTOMY     COLONOSCOPY     TUBAL LIGATION      Social History   Socioeconomic History   Marital status: Widowed    Spouse name: Not on file   Number of children: Not on file   Years of education: Not on file   Highest education level: Not on file  Occupational History   Not on file  Tobacco Use   Smoking status: Never   Smokeless tobacco: Never  Vaping Use   Vaping status: Never Used  Substance and Sexual Activity   Alcohol use: No   Drug use: No   Sexual activity: Not on file  Other Topics Concern   Not on file  Social History Narrative   Lives alone in [place] style home with drive in garage downstairs, so she walks up stairs to her living space   Plans to age in place    No falls; no problem with stairs    Has 2 children who live near    3 grandchildren live near;    2 grands live away; one in  [place]; one going [place]   One going to [employer] in  [place]    Social Drivers of Health   Tobacco Use: Low Risk (09/05/2023)   Patient History    Smoking Tobacco Use: Never    Smokeless Tobacco Use: Never    Passive Exposure: Not on file  Financial Resource Strain: Low Risk (03/14/2021)   Overall Financial Resource Strain (CARDIA)    Difficulty of Paying Living Expenses: Not hard at all   Food Insecurity: No Food Insecurity (03/14/2021)   Hunger Vital Sign    Worried About Running Out of Food in the Last Year: Never true    Ran Out of Food in the Last Year: Never true  Transportation Needs: No Transportation Needs (03/14/2021)   PRAPARE - Administrator, Civil Service (Medical): No    Lack of Transportation (Non-Medical): No  Physical Activity: Sufficiently Active (01/09/2024)   Exercise Vital Sign    Days of Exercise per Week: 4 days    Minutes of Exercise per Session: 60 min  Stress: No Stress Concern Present (01/09/2024)   [employer] of Occupational Health - Occupational Stress Questionnaire    Feeling of Stress: Not at all  Social Connections: Unknown (01/09/2024)   Social Connection and Isolation Panel    Frequency of Communication with Friends and Family: More than three times a week    Frequency of Social Gatherings with Friends and Family: Three times a week    Attends Religious Services: More than 4 times per year    Active Member of Clubs or Organizations: No    Attends Banker Meetings: Never    Marital Status: Patient declined  Intimate Partner Violence: Not on file  Depression (PHQ2-9): Low Risk (01/09/2024)   Depression (PHQ2-9)    PHQ-2 Score: 0  Alcohol Screen: Not on file  Housing: Not on file  Utilities: Not on file  Health Literacy: Not on file    Family History  Problem Relation Age of Onset   Diabetes Other    Stroke Other    Heart disease Other    Hypertension Other    Heart disease Mother    Heart disease Father    Diabetes Father    Colon cancer Neg Hx    Esophageal cancer Neg Hx    Stomach cancer Neg Hx    Rectal cancer Neg Hx     Medications Ordered Prior to Encounter[1]  Allergies[2]     Physical Exam Vitals requested from patient and listed below if patient had equipment and was able to obtain at home for this virtual visit: There were no vitals filed for this visit. Estimated body mass  index is 25.69 kg/m as calculated from the following:   Height as of 05/21/23: 5' 3 (1.6 m).   Weight as of 05/21/23: 145 lb (65.8 kg).  EKG (optional): deferred due to virtual visit  GENERAL: alert, oriented, no acute distress detected, full vision exam deferred due to pandemic and/or virtual encounter   HEENT: atraumatic, conjunttiva clear, no obvious abnormalities on inspection of external nose and ears  NECK: normal movements of the head and neck  LUNGS: on inspection no signs of respiratory distress, breathing rate appears normal, no obvious gross SOB, gasping or wheezing  CV: no obvious cyanosis  MS: moves all visible extremities without noticeable abnormality  PSYCH/NEURO: pleasant and cooperative, no obvious depression or anxiety, speech and thought processing grossly intact, Cognitive function grossly intact  Flowsheet Row Video Visit from 04/12/2022 in   [facility]  PHQ-9 Total Score 0        01/09/2024    4:26 PM 04/12/2022    4:45 PM 02/22/2022    1:06 PM 03/14/2021    3:00 PM 01/13/2021    9:59 AM  Depression screen PHQ 2/9  Decreased Interest 0 0 0 0 0  Down, Depressed, Hopeless 0 0 0 0 0  PHQ - 2 Score 0 0 0 0 0  Altered sleeping  0 0    Tired, decreased energy  0 0    Change in appetite  0 0    Feeling bad or failure about yourself   0 0    Trouble concentrating  0 0    Moving slowly or fidgety/restless  0 0    Suicidal thoughts  0 0    PHQ-9 Score  0  0     Difficult doing work/chores  Not difficult at all Not difficult at all       Data saved with a previous flowsheet row definition       03/14/2021    3:00 PM 02/22/2022    1:06 PM 04/12/2022    4:44 PM 02/27/2023    8:56 AM 01/09/2024    4:18 PM  Fall Risk  Falls in the past year? 0 0 0 0 0  Was there an injury with Fall?  0  0  0  0  Fall Risk Category Calculator  0 0 0 0  Patient at Risk for Falls Due to No Fall Risks No Fall Risks  No Fall Risks No Fall Risks   Fall risk Follow up Falls evaluation completed;Education provided;Falls prevention discussed  Falls evaluation completed  Falls evaluation completed Falls evaluation completed     Data saved with a previous flowsheet row definition     SUMMARY AND PLAN:  Encounter for Medicare annual wellness exam   Discussed applicable health maintenance/preventive health measures and advised and referred or ordered per patient preferences: -declines covid and shingles vaccines -declines bone density test -[facility], updated Health Maintenance  Topic Date Due   Medicare Annual Wellness (AWV)  04/12/2023   COVID-19 Vaccine (4 - 2025-26 season) 04/28/2027 (Originally 09/30/2023)   Zoster Vaccines- Shingrix (1 of 2) 07/13/2027 (Originally 04/30/1959)   DTaP/Tdap/Td (3 - Tdap) 06/13/2026   Pneumococcal Vaccine: 50+ Years  Completed   Influenza Vaccine  Completed   Bone Density Scan  Completed   Meningococcal B Vaccine  Aged Out      Education and counseling on the following was provided based on the above review of health and a plan/checklist for the patient, along with additional information discussed, was provided for the patient in the patient instructions :  -request advanced directives -Advised and counseled on a healthy lifestyle - including the importance of a healthy diet, regular physical activity, social connections and stress management. -Reviewed patient's current diet. Advised and counseled on a whole foods based healthy diet. Advised to increase veggie and fruit intake.  A summary of a healthy diet was provided in the Patient Instructions.  -reviewed patient's current physical activity level and discussed exercise guidelines for adults. Discussed community resources and ideas for safe exercise at home to assist in meeting exercise guideline recommendations in a safe and healthy way.  -Advise yearly dental visits at minimum and regular eye exams  Follow up: see patient  instructions     Patient Instructions  I really enjoyed getting  to talk with you today! I am available on Tuesdays and Thursdays for virtual visits if you have any questions or concerns, or if I can be of any further assistance.   CHECKLIST FROM ANNUAL WELLNESS VISIT:  -Follow up (please call to schedule if not scheduled after visit):   -yearly for annual wellness visit with primary care office  Here is a list of your preventive care/health maintenance measures and the plan for each if any are due:  PLAN For any measures below that may be due:   Health Maintenance  Topic Date Due   Medicare Annual Wellness (AWV)  04/12/2023   COVID-19 Vaccine (4 - 2025-26 season) 04/28/2027 (Originally 09/30/2023)   Zoster Vaccines- Shingrix (1 of 2) 07/13/2027 (Originally 04/30/1959)   DTaP/Tdap/Td (3 - Tdap) 06/13/2026   Pneumococcal Vaccine: 50+ Years  Completed   Influenza Vaccine  Completed   Bone Density Scan  Completed   Meningococcal B Vaccine  Aged Out    -See a dentist at least yearly  -Get your eyes checked and then per your eye specialist's recommendations  -Other issues addressed today:   -I have included below further information regarding a healthy whole foods based diet, physical activity guidelines for adults, stress management and opportunities for social connections. I hope you find this information useful.   -----------------------------------------------------------------------------------------------------------------------------------------------------------------------------------------------------------------------------------------------------------    NUTRITION: -eat real food: lots of colorful vegetables (half the plate) and fruits -5-7 servings of vegetables and fruits per day (fresh or steamed is best), exp. 2 servings of vegetables with lunch and dinner and 2 servings of fruit per day. Berries and greens such as kale and collards are great choices.  -consume on  a regular basis:  fresh fruits, fresh veggies, fish, nuts, seeds, healthy oils (such as olive oil, avocado oil), whole grains (make sure for bread/pasta/crackers/etc., that the first ingredient on label contains the word whole), legumes. -can eat small amounts of dairy and lean meat (no larger than the palm of your hand), but avoid processed meats such as ham, bacon, lunch meat, etc. -drink water -try to avoid fast food and pre-packaged foods, processed meat, ultra processed foods/beverages (donuts, candy, etc.) -most experts advise limiting sodium to < 2300mg  per day, should limit further is any chronic conditions such as high blood pressure, heart disease, diabetes, etc. The American Heart Association advised that < 1500mg  is is ideal -try to avoid foods/beverages that contain any ingredients with names you do not recognize  -try to avoid foods/beverages  with added sugar or sweeteners/sweets  -try to avoid sweet drinks (including diet drinks): soda, juice, Gatorade, sweet tea, power drinks, diet drinks -try to avoid white rice, white bread, pasta (unless whole grain)  EXERCISE GUIDELINES FOR ADULTS: -if you wish to increase your physical activity, do so gradually and with the approval of your doctor -STOP and seek medical care immediately if you have any chest pain, chest discomfort or trouble breathing when starting or increasing exercise  -move and stretch your body, legs, feet and arms when sitting for long periods -Physical activity guidelines for optimal health in adults: -get at least 150 minutes per week of moderate exercise (can talk, but not sing); this is about 20-30 minutes of sustained activity 5-7 days per week or two 10-15 minute episodes of sustained activity 5-7 days per week -do some muscle building/resistance training/strength training at least 2 days per week  -balance exercises 3+ days per week:   Stand somewhere where you have something sturdy to hold onto if  you lose  balance    1) lift up on toes, then back down, start with 5x per day and work up to 20x   2) stand and lift one leg straight out to the side so that foot is a few inches of the floor, start with 5x each side and work up to 20x each side   3) stand on one foot, start with 5 seconds each side and work up to 20 seconds on each side  If you need ideas or help with getting more active:  -Silver sneakers https://tools.silversneakers.com  -Walk with a Doc: Http://www.duncan-williams.com/  -try to include resistance (weight lifting/strength building) and balance exercises twice per week: or the following link for ideas: http://castillo-powell.com/  buyducts.dk  STRESS MANAGEMENT: -can try meditating, or just sitting quietly with deep breathing while intentionally relaxing all parts of your body for 5 minutes daily -if you need further help with stress, anxiety or depression please follow up with your primary doctor or contact the wonderful folks at [facility]: [phone]  SOCIAL CONNECTIONS: -options in [place] if you wish to engage in more social and exercise related activities:  -Silver sneakers https://tools.silversneakers.com  -Walk with a Doc: Http://www.duncan-williams.com/  -Check out the [facility] Active Adults 50+ section on the [place] of [employer] (hiking clubs, book clubs, cards and games, chess, exercise classes, aquatic classes and much more) - see the website for details: https://www.[place].gov/departments/parks-recreation/active-adults50  -YouTube has lots of exercise videos for different ages and abilities as well  -[facility] (a variety of indoor and outdoor inperson activities for adults). [phone]. [address].  -Virtual Online Classes (a variety of topics): see seniorplanet.org or call [phone]  -consider volunteering at a  school, hospice center, church, senior center or elsewhere            Chiquita JONELLE Cramp, DO      [1]  Current Outpatient Medications on File Prior to Visit  Medication Sig Dispense Refill   Calcium Carbonate (CALCIUM 600 PO) Take 1 tablet by mouth daily.     No current facility-administered medications on file prior to visit.  [2] No Known Allergies

## 2024-02-10 ENCOUNTER — Telehealth: Payer: Self-pay | Admitting: *Deleted

## 2024-02-10 DIAGNOSIS — Z0000 Encounter for general adult medical examination without abnormal findings: Secondary | ICD-10-CM

## 2024-02-10 NOTE — Telephone Encounter (Signed)
 Copied from CRM #[id number]. Topic: Clinical - Request for Lab/Test Order >> Feb 10, 2024 11:00 AM Amy B wrote: Reason for CRM: Patient requests lab order before her annual physical

## 2024-02-11 NOTE — Telephone Encounter (Signed)
 advised that insurance may not pay for the labs before her visit. Pt stated that she will pay out of pocket if they don't cover this. Pt stated that she would like her labs before her appt.  ok to place orders.

## 2024-02-12 NOTE — Telephone Encounter (Signed)
Pt has been scheduled for labs 

## 2024-02-12 NOTE — Telephone Encounter (Signed)
 Patient notified of update  and verbalized understanding. Pt also notified again that insurance may not pay for labs pt is still ok with paying out-of-pocket. Pt also, informed that she will need to sign a form for this as well and verbalized understanding.

## 2024-02-20 ENCOUNTER — Other Ambulatory Visit

## 2024-02-20 ENCOUNTER — Other Ambulatory Visit: Payer: Self-pay | Admitting: Adult Health

## 2024-02-20 DIAGNOSIS — E039 Hypothyroidism, unspecified: Secondary | ICD-10-CM

## 2024-02-20 DIAGNOSIS — Z0000 Encounter for general adult medical examination without abnormal findings: Secondary | ICD-10-CM

## 2024-02-20 LAB — CBC WITH DIFFERENTIAL/PLATELET
Basophils Absolute: 0 K/uL (ref 0.0–0.1)
Basophils Relative: 0.7 % (ref 0.0–3.0)
Eosinophils Absolute: 0.1 K/uL (ref 0.0–0.7)
Eosinophils Relative: 2.1 % (ref 0.0–5.0)
HCT: 41.4 % (ref 36.0–46.0)
Hemoglobin: 14.1 g/dL (ref 12.0–15.0)
Lymphocytes Relative: 33.6 % (ref 12.0–46.0)
Lymphs Abs: 1.9 K/uL (ref 0.7–4.0)
MCHC: 34.1 g/dL (ref 30.0–36.0)
MCV: 93.4 fl (ref 78.0–100.0)
Monocytes Absolute: 0.4 K/uL (ref 0.1–1.0)
Monocytes Relative: 7.2 % (ref 3.0–12.0)
Neutro Abs: 3.3 K/uL (ref 1.4–7.7)
Neutrophils Relative %: 56.4 % (ref 43.0–77.0)
Platelets: 260 K/uL (ref 150.0–400.0)
RBC: 4.43 Mil/uL (ref 3.87–5.11)
RDW: 13.4 % (ref 11.5–15.5)
WBC: 5.8 K/uL (ref 4.0–10.5)

## 2024-02-20 LAB — COMPREHENSIVE METABOLIC PANEL WITH GFR
ALT: 12 U/L (ref 3–35)
AST: 18 U/L (ref 5–37)
Albumin: 4.4 g/dL (ref 3.5–5.2)
Alkaline Phosphatase: 78 U/L (ref 39–117)
BUN: 12 mg/dL (ref 6–23)
CO2: 29 meq/L (ref 19–32)
Calcium: 9.7 mg/dL (ref 8.4–10.5)
Chloride: 104 meq/L (ref 96–112)
Creatinine, Ser: 0.72 mg/dL (ref 0.40–1.20)
GFR: 77.65 mL/min
Glucose, Bld: 90 mg/dL (ref 70–99)
Potassium: 4.3 meq/L (ref 3.5–5.1)
Sodium: 139 meq/L (ref 135–145)
Total Bilirubin: 0.4 mg/dL (ref 0.2–1.2)
Total Protein: 7.7 g/dL (ref 6.0–8.3)

## 2024-02-20 LAB — LIPID PANEL
Cholesterol: 205 mg/dL — ABNORMAL HIGH (ref 28–200)
HDL: 64.6 mg/dL
LDL Cholesterol: 124 mg/dL — ABNORMAL HIGH (ref 10–99)
NonHDL: 140.21
Total CHOL/HDL Ratio: 3
Triglycerides: 80 mg/dL (ref 10.0–149.0)
VLDL: 16 mg/dL (ref 0.0–40.0)

## 2024-02-20 LAB — TSH: TSH: 4.05 u[IU]/mL (ref 0.35–5.50)

## 2024-02-28 ENCOUNTER — Encounter: Payer: Self-pay | Admitting: Adult Health

## 2024-02-28 ENCOUNTER — Ambulatory Visit (INDEPENDENT_AMBULATORY_CARE_PROVIDER_SITE_OTHER): Payer: Medicare Other | Admitting: Adult Health

## 2024-02-28 VITALS — BP 138/88 | HR 68 | Temp 98.0°F | Ht 63.0 in | Wt 143.0 lb

## 2024-02-28 DIAGNOSIS — M170 Bilateral primary osteoarthritis of knee: Secondary | ICD-10-CM

## 2024-02-28 DIAGNOSIS — E7849 Other hyperlipidemia: Secondary | ICD-10-CM

## 2024-02-28 NOTE — Progress Notes (Signed)
 "  Subjective:    Patient ID: Cheryl Franklin, female    DOB: 06/16/1940, 84 y.o.   MRN: [id number]  HPI Patient presents for  chronic care examination. She is a pleasant 84 year old female who  has a past medical history of Allergy, Baker's cyst, GERD (gastroesophageal reflux disease), and OA (osteoarthritis).  Hyperlipidemia - refuses statin medication. She understands her risks including stroke and MI.  Lab Results  Component Value Date   CHOL 205 (H) 02/20/2024   HDL 64.60 02/20/2024   LDLCALC 124 (H) 02/20/2024   LDLDIRECT 123.7 04/16/2011   TRIG 80.0 02/20/2024   CHOLHDL 3 02/20/2024   Chronic knee pain - managed by orthopedics. Has steroid injections periodically. She reports that she has not had to have a steroid injection in quite some time. He knees are not hurting her as bad.   All immunizations and health maintenance protocols were reviewed with the patient and needed orders were placed. She is up to date on routine vaccinations   Appropriate screening laboratory values were ordered for the patient including screening of hyperlipidemia, renal function and hepatic function.  Medication reconciliation,  past medical history, social history, problem list and allergies were reviewed in detail with the patient  Goals were established with regard to weight loss, exercise, and  diet in compliance with medications. She does stay active and eats healthy.   Wt Readings from Last 3 Encounters:  02/28/24 143 lb (64.9 kg)  05/21/23 145 lb (65.8 kg)  02/27/23 142 lb (64.4 kg)   Review of Systems  Constitutional: Negative.   HENT: Negative.    Eyes: Negative.   Respiratory: Negative.    Cardiovascular: Negative.   Gastrointestinal: Negative.   Endocrine: Negative.   Genitourinary: Negative.   Musculoskeletal:  Positive for arthralgias.  Skin: Negative.   Allergic/Immunologic: Negative.   Neurological: Negative.   Hematological: Negative.   Psychiatric/Behavioral: Negative.      Past Medical History:  Diagnosis Date   Allergy    Baker's cyst    GERD (gastroesophageal reflux disease)    OA (osteoarthritis)    no per pt    Social History   Socioeconomic History   Marital status: Widowed    Spouse name: Not on file   Number of children: Not on file   Years of education: Not on file   Highest education level: Not on file  Occupational History   Not on file  Tobacco Use   Smoking status: Never   Smokeless tobacco: Never  Vaping Use   Vaping status: Never Used  Substance and Sexual Activity   Alcohol use: No   Drug use: No   Sexual activity: Not on file  Other Topics Concern   Not on file  Social History Narrative   Lives [place] in [place] home with drive in garage downstairs, so she walks up stairs to her living space   Plans to age in place    No falls; no problem with stairs    Has 2 children who live near    3 grandchildren live near;    2 grands live away; one in  [place]; one going to  [employer] this summer   One going to [employer] in  [place]    Social Drivers of Health   Tobacco Use: Low Risk (02/28/2024)   Patient History    Smoking Tobacco Use: Never    Smokeless Tobacco Use: Never    Passive Exposure: Not on file  Financial  Resource Strain: Low Risk (03/14/2021)   Overall Financial Resource Strain (CARDIA)    Difficulty of Paying Living Expenses: Not hard at all  Food Insecurity: No Food Insecurity (03/14/2021)   Hunger Vital Sign    Worried About Running Out of Food in the Last Year: Never true    Ran Out of Food in the Last Year: Never true  Transportation Needs: No Transportation Needs (03/14/2021)   PRAPARE - Administrator, Civil Service (Medical): No    Lack of Transportation (Non-Medical): No  Physical Activity: Sufficiently Active (01/09/2024)   Exercise Vital Sign    Days of Exercise per Week: 4 days    Minutes of Exercise per Session: 60 min  Stress: No Stress Concern Present (01/09/2024)    [employer] of Occupational Health - Occupational Stress Questionnaire    Feeling of Stress: Not at all  Social Connections: Unknown (01/09/2024)   Social Connection and Isolation Panel    Frequency of Communication with Friends and Family: More than three times a week    Frequency of Social Gatherings with Friends and Family: Three times a week    Attends Religious Services: More than 4 times per year    Active Member of Clubs or Organizations: No    Attends Banker Meetings: Never    Marital Status: Patient declined  Intimate Partner Violence: Not on file  Depression (PHQ2-9): Low Risk (01/09/2024)   Depression (PHQ2-9)    PHQ-2 Score: 0  Alcohol Screen: Not on file  Housing: Not on file  Utilities: Not on file  Health Literacy: Not on file    Past Surgical History:  Procedure Laterality Date   APPENDECTOMY     COLONOSCOPY     TUBAL LIGATION      Family History  Problem Relation Age of Onset   Diabetes Other    Stroke Other    Heart disease Other    Hypertension Other    Heart disease Mother    Heart disease Father    Diabetes Father    Colon cancer Neg Hx    Esophageal cancer Neg Hx    Stomach cancer Neg Hx    Rectal cancer Neg Hx     Allergies[1]  Medications Ordered Prior to Encounter[2]  BP 138/88   Pulse 68   Temp 98 F (36.7 C) (Oral)   Ht 5' 3 (1.6 m)   Wt 143 lb (64.9 kg)   SpO2 98%   BMI 25.33 kg/m       Objective:   Physical Exam Vitals and nursing note reviewed.  Constitutional:      General: She is not in acute distress.    Appearance: Normal appearance. She is not ill-appearing.  HENT:     Head: Normocephalic and atraumatic.     Right Ear: Tympanic membrane, ear canal and external ear normal. There is no impacted cerumen.     Left Ear: Tympanic membrane, ear canal and external ear normal. There is no impacted cerumen.     Nose: Nose normal. No congestion or rhinorrhea.     Mouth/Throat:     Mouth: Mucous  membranes are moist.     Pharynx: Oropharynx is clear.  Eyes:     Extraocular Movements: Extraocular movements intact.     Conjunctiva/sclera: Conjunctivae normal.     Pupils: Pupils are equal, round, and reactive to light.  Neck:     Vascular: No carotid bruit.  Cardiovascular:     Rate and Rhythm:  Normal rate and regular rhythm.     Pulses: Normal pulses.     Heart sounds: No murmur heard.    No friction rub. No gallop.  Pulmonary:     Effort: Pulmonary effort is normal.     Breath sounds: Normal breath sounds.  Abdominal:     General: Abdomen is flat. Bowel sounds are normal. There is no distension.     Palpations: Abdomen is soft. There is no mass.     Tenderness: There is no abdominal tenderness. There is no guarding or rebound.     Hernia: No hernia is present.  Musculoskeletal:        General: Normal range of motion.     Cervical back: Normal range of motion and neck supple.  Lymphadenopathy:     Cervical: No cervical adenopathy.  Skin:    General: Skin is warm and dry.     Capillary Refill: Capillary refill takes less than 2 seconds.  Neurological:     General: No focal deficit present.     Mental Status: She is alert and oriented to person, place, and time.  Psychiatric:        Mood and Affect: Mood normal.        Behavior: Behavior normal.        Thought Content: Thought content normal.        Judgment: Judgment normal.        Assessment & Plan:  1. Other hyperlipidemia (Primary) - Lab work done prior to this visit. CBC, CMP and TSH WNL - lipid panel was elevated as it has in the past but she continues to refuse statin therapy   2. Primary osteoarthritis of both knees - Follow up with orthopedics as needed   Darleene Shape, NP        [1] No Known Allergies [2]  Current Outpatient Medications on File Prior to Visit  Medication Sig Dispense Refill   Calcium Carbonate (CALCIUM 600 PO) Take 1 tablet by mouth daily.     No current  facility-administered medications on file prior to visit.   "

## 2024-02-28 NOTE — Patient Instructions (Signed)
 It was great seeing you today   Everything looks and sounds good.   Lets follow up in one year
